# Patient Record
Sex: Male | Born: 1990 | Race: White | Hispanic: No | Marital: Single | Smoking: Current every day smoker
Health system: Southern US, Community
[De-identification: ages and names within clinical notes are randomized; demographics above are authoritative.]

## PROBLEM LIST (undated history)

## (undated) DIAGNOSIS — F319 Bipolar disorder, unspecified: Secondary | ICD-10-CM

## (undated) DIAGNOSIS — J45909 Unspecified asthma, uncomplicated: Secondary | ICD-10-CM

## (undated) DIAGNOSIS — F909 Attention-deficit hyperactivity disorder, unspecified type: Secondary | ICD-10-CM

## (undated) DIAGNOSIS — F1910 Other psychoactive substance abuse, uncomplicated: Secondary | ICD-10-CM

## (undated) DIAGNOSIS — G43909 Migraine, unspecified, not intractable, without status migrainosus: Secondary | ICD-10-CM

---
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
## 2009-09-11 ENCOUNTER — Emergency Department (HOSPITAL_COMMUNITY): Admission: EM | Admit: 2009-09-11 | Discharge: 2009-09-11 | Payer: Self-pay | Admitting: Emergency Medicine

## 2010-01-19 ENCOUNTER — Emergency Department (HOSPITAL_COMMUNITY)
Admission: EM | Admit: 2010-01-19 | Discharge: 2010-01-19 | Payer: Self-pay | Source: Home / Self Care | Admitting: Emergency Medicine

## 2010-05-20 ENCOUNTER — Emergency Department (HOSPITAL_COMMUNITY)
Admission: EM | Admit: 2010-05-20 | Discharge: 2010-05-20 | Disposition: A | Discharge status: Home / Self Care | Payer: Self-pay | Attending: Emergency Medicine | Admitting: Emergency Medicine

## 2010-05-20 DIAGNOSIS — R42 Dizziness and giddiness: Secondary | ICD-10-CM | POA: Insufficient documentation

## 2010-05-20 DIAGNOSIS — R209 Unspecified disturbances of skin sensation: Secondary | ICD-10-CM | POA: Insufficient documentation

## 2010-05-20 DIAGNOSIS — E860 Dehydration: Secondary | ICD-10-CM | POA: Insufficient documentation

## 2010-05-20 LAB — BASIC METABOLIC PANEL
BUN: 13 mg/dL (ref 6–23)
CO2: 20 mEq/L (ref 19–32)
Chloride: 103 mEq/L (ref 96–112)
Creatinine, Ser: 0.93 mg/dL (ref 0.4–1.5)
GFR calc Af Amer: >60 mL/min (ref >60)
GFR calc non Af Amer: >60 mL/min (ref >60)
Potassium: 3.2 mEq/L — ABNORMAL LOW (ref 3.5–5.1)

## 2010-05-20 LAB — CBC
HCT: 40.8 % (ref 39.0–52.0)
Hemoglobin: 14.5 g/dL (ref 13.0–17.0)
MCHC: 35.5 g/dL (ref 30.0–36.0)
MCV: 86.3 fL (ref 78.0–100.0)
Platelets: 262 10*3/uL (ref 150–400)
WBC: 8.2 10*3/uL (ref 4.0–10.5)

## 2010-05-20 LAB — DIFFERENTIAL
Eosinophils Absolute: 0.2 10*3/uL (ref 0.0–0.7)
Lymphocytes Relative: 24 % (ref 12–46)
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
## 2010-08-23 ENCOUNTER — Encounter: Payer: Self-pay | Admitting: Emergency Medicine

## 2010-08-23 ENCOUNTER — Emergency Department (INDEPENDENT_AMBULATORY_CARE_PROVIDER_SITE_OTHER): Payer: Self-pay

## 2010-08-23 ENCOUNTER — Emergency Department (HOSPITAL_BASED_OUTPATIENT_CLINIC_OR_DEPARTMENT_OTHER)
Admission: EM | Admit: 2010-08-23 | Discharge: 2010-08-24 | Disposition: A | Discharge status: Home / Self Care | Payer: Self-pay | Attending: Emergency Medicine | Admitting: Emergency Medicine

## 2010-08-23 DIAGNOSIS — S02610A Fracture of condylar process of mandible, unspecified side, initial encounter for closed fracture: Secondary | ICD-10-CM

## 2010-08-23 DIAGNOSIS — IMO0002 Reserved for concepts with insufficient information to code with codable children: Secondary | ICD-10-CM

## 2010-08-23 DIAGNOSIS — T148XXA Other injury of unspecified body region, initial encounter: Secondary | ICD-10-CM

## 2010-08-23 DIAGNOSIS — S0181XA Laceration without foreign body of other part of head, initial encounter: Secondary | ICD-10-CM

## 2010-08-23 DIAGNOSIS — F10929 Alcohol use, unspecified with intoxication, unspecified: Secondary | ICD-10-CM

## 2010-08-23 DIAGNOSIS — S0180XA Unspecified open wound of other part of head, initial encounter: Secondary | ICD-10-CM | POA: Insufficient documentation

## 2010-08-23 DIAGNOSIS — S02609A Fracture of mandible, unspecified, initial encounter for closed fracture: Secondary | ICD-10-CM | POA: Insufficient documentation

## 2010-08-23 DIAGNOSIS — F1010 Alcohol abuse, uncomplicated: Secondary | ICD-10-CM | POA: Insufficient documentation

## 2010-08-23 NOTE — ED Notes (Signed)
Pt was able to cooperate with CT scanning process with continued verbal and physical stimulation.  Upon returning to room pt states that he is "tired of dealing with this crap, he wants to go home" and then returned to sleep again.
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
## 2010-08-23 NOTE — ED Notes (Signed)
[facility] would not participate with the CT scanning process. Provider informed.

## 2010-08-24 ENCOUNTER — Encounter (HOSPITAL_BASED_OUTPATIENT_CLINIC_OR_DEPARTMENT_OTHER): Payer: Self-pay

## 2010-08-24 ENCOUNTER — Emergency Department (INDEPENDENT_AMBULATORY_CARE_PROVIDER_SITE_OTHER): Payer: Self-pay

## 2010-08-24 DIAGNOSIS — M25579 Pain in unspecified ankle and joints of unspecified foot: Secondary | ICD-10-CM

## 2010-08-24 DIAGNOSIS — M25569 Pain in unspecified knee: Secondary | ICD-10-CM

## 2010-08-24 MED ORDER — OXYCODONE-ACETAMINOPHEN 5-325 MG PO TABS: 2.0000 | ORAL_TABLET | ORAL | Status: AC | PRN

## 2010-08-24 MED ORDER — OXYCODONE-ACETAMINOPHEN 5-325 MG PO TABS
1.0000 | ORAL_TABLET | Freq: Once | ORAL | Status: AC | PRN
  Administered 2010-08-24: 1 via ORAL
  Filled 2010-08-24: qty 1

## 2010-08-24 MED ORDER — SODIUM CHLORIDE 0.9 % IV BOLUS (SEPSIS)
1000.0000 mL | Freq: Once | INTRAVENOUS | Status: AC
  Administered 2010-08-24: 1000 mL via INTRAVENOUS

## 2010-08-24 NOTE — ED Notes (Signed)
At this time patient remains difficult to arouse.  With continued stimulation pt did wake up and stated that he wished to have something for pain.  Pt was informed that he would have to remain awake for a period of time before he would be allowed to have something for pain.  Pt promptly returned to sleep.
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
## 2010-08-24 NOTE — ED Notes (Signed)
Pt has remained alert, oriented since going to radiology for x-rays of ankle and knee.  Pt states that he is in severe pain.  Pt was given PRN dose of percocet and was discharged to home via taxi.

## 2010-08-27 NOTE — ED Provider Notes (Signed)
Medical screening examination/treatment/procedure(s) were performed by non-physician practitioner and as supervising physician I was immediately available for consultation/collaboration.  Juliet Rude. Rubin Payor, MD 08/27/10 [phone]

## 2010-10-27 LAB — CULTURE, ROUTINE-ABSCESS

## 2011-08-02 ENCOUNTER — Emergency Department (HOSPITAL_COMMUNITY)
Admission: EM | Admit: 2011-08-02 | Discharge: 2011-08-02 | Disposition: A | Discharge status: Home / Self Care | Payer: Self-pay | Attending: Emergency Medicine | Admitting: Emergency Medicine

## 2011-08-02 ENCOUNTER — Encounter (HOSPITAL_COMMUNITY): Payer: Self-pay | Admitting: *Deleted

## 2011-08-02 DIAGNOSIS — F17200 Nicotine dependence, unspecified, uncomplicated: Secondary | ICD-10-CM | POA: Insufficient documentation

## 2011-08-02 DIAGNOSIS — F1910 Other psychoactive substance abuse, uncomplicated: Secondary | ICD-10-CM | POA: Insufficient documentation

## 2011-08-02 HISTORY — DX: Other psychoactive substance abuse, uncomplicated: F19.10
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
## 2011-08-02 NOTE — ED Provider Notes (Signed)
History     CSN: [id number]  Arrival date & time 08/02/11  1242   First MD Initiated Contact with Patient 08/02/11 1306      Chief Complaint  Patient presents with  . V70.1    pt sent from [facility] for medical clearance    (Consider location/radiation/quality/duration/timing/severity/associated sxs/prior treatment) The history is provided by the patient.   the patient reports she's been without alcohol or drugs for the past 30 days he was staying at the [facility] until 3 days ago.  He went out and started drinking and using drugs and has continued to the weekend.  He now requests assistance with his substance abuse problems.  He denies homicidal or suicidal thoughts.  He has no other complaints.  He reports he went to [facility] who referred him to the emergency department for medical clearance.  The patient is without medical complaints.  Past Medical History  Diagnosis Date  . Drug abuse     History reviewed. No pertinent past surgical history.  No family history on file.  History  Substance Use Topics  . Smoking status: Current Everyday Smoker -- 0.5 packs/day  . Smokeless tobacco: Not on file  . Alcohol Use: 9.0 oz/week    15 Cans of beer per week      Review of Systems  All other systems reviewed and are negative.    Allergies  Review of patient's allergies indicates no known allergies.  Home Medications   Current Outpatient Rx  Name Route Sig Dispense Refill  . ONE-DAILY MULTI VITAMINS PO TABS Oral Take 1 tablet by mouth daily.        BP 128/62  Pulse 79  Temp 98.1 F (36.7 C) (Oral)  Resp 18  Wt 165 lb (74.844 kg)  SpO2 99%  Physical Exam  Nursing note and vitals reviewed. Constitutional: He is oriented to person, place, and time. He appears well-developed and well-nourished.  HENT:  Head: Normocephalic and atraumatic.  Eyes: EOM are normal.  Neck: Normal range of motion.  Cardiovascular: Normal rate, regular rhythm, normal heart sounds and  intact distal pulses.   Pulmonary/Chest: Effort normal and breath sounds normal. No respiratory distress.  Abdominal: Soft. He exhibits no distension. There is no tenderness.  Musculoskeletal: Normal range of motion.  Neurological: He is alert and oriented to person, place, and time.  Skin: Skin is warm and dry.  Psychiatric: He has a normal mood and affect. Judgment normal.    ED Course  Procedures (including critical care time)  Labs Reviewed - No data to display No results found.   1. Substance abuse       MDM  This appears to be isolated substance abuse.  The patient has been clean for 30 days and is only been on drug and alcohol binge for the last several days.  He does not need inpatient detox.  This is not the appropriate avenue for long-term treatment programs.  The patient be discharged home with outpatient resources to be contacted        Lyanne Co, MD 08/02/11 1312

## 2012-10-21 ENCOUNTER — Encounter (HOSPITAL_COMMUNITY): Payer: Self-pay | Admitting: Physical Medicine and Rehabilitation

## 2012-10-21 ENCOUNTER — Emergency Department (HOSPITAL_COMMUNITY)
Admission: EM | Admit: 2012-10-21 | Discharge: 2012-10-21 | Disposition: A | Discharge status: Home / Self Care | Payer: Self-pay | Attending: Emergency Medicine | Admitting: Emergency Medicine

## 2012-10-21 DIAGNOSIS — R509 Fever, unspecified: Secondary | ICD-10-CM | POA: Insufficient documentation

## 2012-10-21 DIAGNOSIS — R110 Nausea: Secondary | ICD-10-CM | POA: Insufficient documentation

## 2012-10-21 DIAGNOSIS — R51 Headache: Secondary | ICD-10-CM | POA: Insufficient documentation

## 2012-10-21 DIAGNOSIS — F17200 Nicotine dependence, unspecified, uncomplicated: Secondary | ICD-10-CM | POA: Insufficient documentation

## 2012-10-21 DIAGNOSIS — J029 Acute pharyngitis, unspecified: Secondary | ICD-10-CM | POA: Insufficient documentation

## 2012-10-21 DIAGNOSIS — Z79899 Other long term (current) drug therapy: Secondary | ICD-10-CM | POA: Insufficient documentation

## 2012-10-21 LAB — RAPID STREP SCREEN (MED CTR MEBANE ONLY): Streptococcus, Group A Screen (Direct): NEGATIVE

## 2012-10-21 MED ORDER — HYDROCODONE-ACETAMINOPHEN 7.5-325 MG/15ML PO SOLN
10.0000 mL | ORAL | Status: AC
  Administered 2012-10-21: 10 mL via ORAL
  Filled 2012-10-21: qty 15

## 2012-10-21 MED ORDER — HYDROCODONE-ACETAMINOPHEN 7.5-325 MG/15ML PO SOLN: 10.0000 mL | ORAL | Status: DC | PRN

## 2012-10-21 MED ORDER — IBUPROFEN 800 MG PO TABS: 800.0000 mg | ORAL_TABLET | Freq: Three times a day (TID) | ORAL | Status: DC

## 2012-10-21 NOTE — ED Notes (Signed)
Pt c/o fever, sweating, body aches, headache, and sore throat. Pt states his boss was sick and he may have the flu. Pt rates pain 10/10 in throat. Pt states he is having difficulty swallowing. Pt smokes. Redness noted to back of throat.

## 2012-10-21 NOTE — ED Provider Notes (Signed)
CSN: [id number]     Arrival date & time 10/21/12  1301 History  This chart was scribed for non-physician practitioner, Trixie Dredge, PA-C working with Shanna Cisco, MD by Greggory Stallion, ED scribe. This patient was seen in room TR11C/TR11C and the patient's care was started at 1:29 PM.   Chief Complaint  Patient presents with  . Sore Throat   The history is provided by the patient. No language interpreter was used.    HPI Comments: Jeffery Hudson is a 22 y.o. male who presents to the Emergency Department complaining of gradual onset, constant sore throat with associated headache that started 3 days ago. He rates the pain 8/10. Pt states he has had nausea and subjective fever. He has not taken any medication for the pain. Pt denies abdominal pain.  Past Medical History  Diagnosis Date  . Drug abuse    No past surgical history on file. History reviewed. No pertinent family history. History  Substance Use Topics  . Smoking status: Current Every Day Smoker -- 0.50 packs/day    Types: Cigarettes  . Smokeless tobacco: Not on file  . Alcohol Use: No    Review of Systems  Constitutional: Positive for fever.  HENT: Positive for sore throat.   Gastrointestinal: Positive for nausea. Negative for abdominal pain.  Neurological: Positive for headaches.  All other systems reviewed and are negative.    Allergies  Review of patient's allergies indicates no known allergies.  Home Medications   Current Outpatient Rx  Name  Route  Sig  Dispense  Refill  . clonazePAM (KLONOPIN) 1 MG tablet   Oral   Take 1 mg by mouth 2 (two) times daily as needed. Anxiety         . Multiple Vitamin (MULTIVITAMIN) tablet   Oral   Take 1 tablet by mouth daily.            BP 149/93  Pulse 112  Temp(Src) 99.2 F (37.3 C) (Oral)  Resp 18  SpO2 96%  Physical Exam  Nursing note and vitals reviewed. Constitutional: He appears well-developed and well-nourished. No distress.  HENT:  Head:  Normocephalic and atraumatic.  Mouth/Throat: Uvula is midline.  Erythema, edema and tonsillar exudate.  Neck: Neck supple.  Anterior cervical lymphadenopathy.   Cardiovascular: Normal rate and regular rhythm.   Pulmonary/Chest: Effort normal and breath sounds normal. No respiratory distress. He has no wheezes. He has no rales. He exhibits no tenderness.  Neurological: He is alert.  Skin: He is not diaphoretic.    ED Course  Procedures (including critical care time)  DIAGNOSTIC STUDIES: Oxygen Saturation is 96% on RA, normal by my interpretation.    COORDINATION OF CARE: 1:33 PM-Discussed treatment plan which includes treating for strep with pt at bedside and pt agreed to plan.   Labs Review Labs Reviewed  RAPID STREP SCREEN  CULTURE, GROUP A STREP   Imaging Review No results found.  MDM   1. Pharyngitis    Pt with several days of sore throat, subjective fever, cough.  Lungs CTAB.  Oropharynx erythematous with exudate, +cervical lymphadenopathy.  Subjective fever.  No fever here.  Per [facility] Criteria, pt tested and sent for culture. No airway concerns. D/C home with lortab elixir, ibuprofen. Discussed result, findings, treatment, and follow up  with patient.  Pt given return precautions.  Pt verbalizes understanding and agrees with plan.        I personally performed the services described in this documentation, which was scribed  in my presence. The recorded information has been reviewed and is accurate.   Trixie Dredge, PA-C 10/21/12 1545

## 2012-10-21 NOTE — ED Notes (Signed)
Discharge instructions reviewed with pt. Pt verbalized understanding.   

## 2012-10-21 NOTE — ED Provider Notes (Signed)
Medical screening examination/treatment/procedure(s) were performed by non-physician practitioner and as supervising physician I was immediately available for consultation/collaboration.  Megan E Docherty, MD 10/21/12 1729 

## 2012-10-21 NOTE — ED Notes (Signed)
Pt presents to department for evaluation of sore throat and fever. Ongoing x1 week. 8/10 pain upon arrival. States "I think I am having problems with my tonsils." pt is alert and oriented x4.

## 2012-10-23 LAB — CULTURE, GROUP A STREP

## 2020-09-17 ENCOUNTER — Other Ambulatory Visit: Payer: Self-pay

## 2020-09-17 ENCOUNTER — Encounter (HOSPITAL_COMMUNITY): Payer: Self-pay | Admitting: Emergency Medicine

## 2020-09-17 ENCOUNTER — Emergency Department (HOSPITAL_COMMUNITY)
Admission: EM | Admit: 2020-09-17 | Discharge: 2020-09-18 | Disposition: A | Discharge status: Home / Self Care | Payer: Self-pay | Attending: Emergency Medicine | Admitting: Emergency Medicine

## 2020-09-17 DIAGNOSIS — R062 Wheezing: Secondary | ICD-10-CM | POA: Insufficient documentation

## 2020-09-17 DIAGNOSIS — Z79899 Other long term (current) drug therapy: Secondary | ICD-10-CM | POA: Insufficient documentation

## 2020-09-17 DIAGNOSIS — Z20822 Contact with and (suspected) exposure to covid-19: Secondary | ICD-10-CM | POA: Insufficient documentation

## 2020-09-17 DIAGNOSIS — R110 Nausea: Secondary | ICD-10-CM | POA: Insufficient documentation

## 2020-09-17 DIAGNOSIS — F1910 Other psychoactive substance abuse, uncomplicated: Secondary | ICD-10-CM | POA: Insufficient documentation

## 2020-09-17 DIAGNOSIS — M7918 Myalgia, other site: Secondary | ICD-10-CM | POA: Insufficient documentation

## 2020-09-17 DIAGNOSIS — Y900 Blood alcohol level of less than 20 mg/100 ml: Secondary | ICD-10-CM | POA: Insufficient documentation

## 2020-09-17 DIAGNOSIS — F17210 Nicotine dependence, cigarettes, uncomplicated: Secondary | ICD-10-CM | POA: Insufficient documentation

## 2020-09-17 LAB — BASIC METABOLIC PANEL
Anion gap: 10 (ref 5–15)
BUN: 16 mg/dL (ref 6–20)
CO2: 25 mmol/L (ref 22–32)
Calcium: 9.6 mg/dL (ref 8.9–10.3)
Chloride: 104 mmol/L (ref 98–111)
Creatinine, Ser: 0.91 mg/dL (ref 0.61–1.24)
GFR, Estimated: >60 mL/min (ref >60)
Glucose, Bld: 91 mg/dL (ref 70–99)
Potassium: 3.5 mmol/L (ref 3.5–5.1)
Sodium: 139 mmol/L (ref 135–145)

## 2020-09-17 LAB — RAPID URINE DRUG SCREEN, HOSP PERFORMED
Amphetamines: NOT DETECTED
Barbiturates: NOT DETECTED
Benzodiazepines: NOT DETECTED
Cocaine: NOT DETECTED
Opiates: NOT DETECTED
Tetrahydrocannabinol: POSITIVE — AB

## 2020-09-17 LAB — CBC WITH DIFFERENTIAL/PLATELET
Abs Immature Granulocytes: 0.02 10*3/uL (ref 0.00–0.07)
Basophils Absolute: 0.1 10*3/uL (ref 0.0–0.1)
Basophils Relative: 1 %
Eosinophils Absolute: 0.2 10*3/uL (ref 0.0–0.5)
Eosinophils Relative: 2 %
HCT: 48 % (ref 39.0–52.0)
Hemoglobin: 16.7 g/dL (ref 13.0–17.0)
Immature Granulocytes: 0 %
Lymphocytes Relative: 29 %
Lymphs Abs: 2.5 10*3/uL (ref 0.7–4.0)
MCH: 31.4 pg (ref 26.0–34.0)
MCHC: 34.8 g/dL (ref 30.0–36.0)
MCV: 90.2 fL (ref 80.0–100.0)
Monocytes Absolute: 1.4 10*3/uL — ABNORMAL HIGH (ref 0.1–1.0)
Monocytes Relative: 17 %
Neutro Abs: 4.4 10*3/uL (ref 1.7–7.7)
Neutrophils Relative %: 51 %
Platelets: 339 10*3/uL (ref 150–400)
RBC: 5.32 MIL/uL (ref 4.22–5.81)
RDW: 13 % (ref 11.5–15.5)
WBC: 8.5 10*3/uL (ref 4.0–10.5)
nRBC: 0 % (ref 0.0–0.2)

## 2020-09-17 LAB — ETHANOL: Alcohol, Ethyl (B): <10 mg/dL (ref <10)

## 2020-09-17 LAB — SALICYLATE LEVEL: Salicylate Lvl: <7 mg/dL — ABNORMAL LOW (ref 7.0–30.0)

## 2020-09-17 LAB — ACETAMINOPHEN LEVEL: Acetaminophen (Tylenol), Serum: <10 ug/mL — ABNORMAL LOW (ref 10–30)

## 2020-09-17 MED ORDER — ALBUTEROL SULFATE HFA 108 (90 BASE) MCG/ACT IN AERS
2.0000 | INHALATION_SPRAY | RESPIRATORY_TRACT | Status: DC | PRN
  Administered 2020-09-17: 2 via RESPIRATORY_TRACT
  Filled 2020-09-17: qty 6.7

## 2020-09-17 MED ORDER — ONDANSETRON HCL 4 MG/2ML IJ SOLN: 4.0000 mg | Freq: Once | INTRAMUSCULAR | Status: DC

## 2020-09-17 MED ORDER — ONDANSETRON 4 MG PO TBDP
4.0000 mg | ORAL_TABLET | Freq: Once | ORAL | Status: AC
  Administered 2020-09-17: 4 mg via ORAL
  Filled 2020-09-17: qty 1

## 2020-09-17 NOTE — ED Triage Notes (Signed)
Pt is here for detox off heroin, states he last used Monday night, and is having withdrawal symptoms. Also states he drinks 3-4 beers/night, has not drank today.

## 2020-09-17 NOTE — ED Provider Notes (Signed)
[facility] EMERGENCY DEPARTMENT Provider Note   CSN: [id number] Arrival date & time: 09/17/20  2103     History Chief Complaint  Patient presents with   Withdrawal    Jeffery Hudson is a 30 y.o. male.  Patient presents to the emergency department with a chief complaint of withdrawal.  He reports withdrawing from heroin.  States that he occasionally uses alcohol, but has not had any today.  He reports some generalized body aches and nausea.  He denies any suicidal or homicidal thoughts currently, but states that he felt somewhat suicidal earlier today.  Denies any recent illnesses.       Past Medical History:  Diagnosis Date   Drug abuse (HCC)     There are no problems to display for this patient.   History reviewed. No pertinent surgical history.     History reviewed. No pertinent family history.  Social History   Tobacco Use   Smoking status: Every Day    Packs/day: 1.00    Years: 10.00    Pack years: 10.00    Types: Cigarettes   Smokeless tobacco: Never  Substance Use Topics   Alcohol use: Yes    Alcohol/week: 28.0 standard drinks    Types: 28 Cans of beer per week    Comment: 3-4 beers per night   Drug use: Yes    Types: Cocaine    Comment: benzos, heroin    Home Medications Prior to Admission medications   Medication Sig Start Date End Date Taking? Authorizing Provider  HYDROcodone-acetaminophen (HYCET) 7.5-325 mg/15 ml solution Take 10 mLs by mouth every 4 (four) hours as needed for pain. 10/21/12   Trixie Dredge, PA-C  ibuprofen (ADVIL,MOTRIN) 800 MG tablet Take 1 tablet (800 mg total) by mouth 3 (three) times daily. 10/21/12   Trixie Dredge, PA-C    Allergies    Patient has no known allergies.  Review of Systems   Review of Systems  All other systems reviewed and are negative.  Physical Exam Updated Vital Signs BP (!) 150/107 (BP Location: Left Arm)   Pulse 78   Temp (!) 97.5 F (36.4 C) (Oral)   Resp 16   Ht 5\' 8"  (1.727  m)   Wt 77.1 kg   SpO2 100%   BMI 25.85 kg/m   Physical Exam Vitals and nursing note reviewed.  Constitutional:      Appearance: He is well-developed.  HENT:     Head: Normocephalic and atraumatic.  Eyes:     Conjunctiva/sclera: Conjunctivae normal.  Cardiovascular:     Rate and Rhythm: Normal rate and regular rhythm.     Heart sounds: No murmur heard. Pulmonary:     Effort: Pulmonary effort is normal. No respiratory distress.     Breath sounds: Wheezing present.  Abdominal:     Palpations: Abdomen is soft.     Tenderness: There is no abdominal tenderness.  Musculoskeletal:     Cervical back: Neck supple.  Skin:    General: Skin is warm and dry.  Neurological:     Mental Status: He is alert and oriented to person, place, and time.  Psychiatric:        Mood and Affect: Mood normal.        Behavior: Behavior normal.    ED Results / Procedures / Treatments   Labs (all labs ordered are listed, but only abnormal results are displayed) Labs Reviewed  CBC WITH DIFFERENTIAL/PLATELET - Abnormal; Notable for the following components:  Result Value   Monocytes Absolute 1.4 (*)    All other components within normal limits  BASIC METABOLIC PANEL  RAPID URINE DRUG SCREEN, HOSP PERFORMED  ETHANOL  ACETAMINOPHEN LEVEL  SALICYLATE LEVEL    EKG None  Radiology No results found.  Procedures Procedures   Medications Ordered in ED Medications  albuterol (VENTOLIN HFA) 108 (90 Base) MCG/ACT inhaler 2 puff (has no administration in time range)  ondansetron (ZOFRAN-ODT) disintegrating tablet 4 mg (4 mg Oral Given 09/17/20 2200)    ED Course  I have reviewed the triage vital signs and the nursing notes.  Pertinent labs & imaging results that were available during my care of the patient were reviewed by me and considered in my medical decision making (see chart for details).    MDM Rules/Calculators/A&P                           Patient here for heroin withdrawal.   He appears medically clear pending normal labs.  Social work informs me that he is able to get into [facility] tonight if medically cleared.  11:09 PM Blood work is reassuring.  Appears stable for discharge to [facility]. Final Clinical Impression(s) / ED Diagnoses Final diagnoses:  Polysubstance abuse [facility])    Rx / DC Orders ED Discharge Orders     None        Roxy Horseman, PA-C 09/17/20 2310    Jacalyn Lefevre, MD 09/18/20 1656

## 2020-09-17 NOTE — Discharge Instructions (Addendum)
Go to [facility] now for recovery services.

## 2020-09-17 NOTE — ED Notes (Signed)
RN reviewed discharge instructions w/ pt. Follow up and outpatient resources reviewed, pt had no further questions. 

## 2020-09-17 NOTE — Social Work (Signed)
CSW met with Pt in triage room. Pt endorses history of polysubstance use. Pt reports smoking heroin for the past year. Pt wishing to go to detox. CSW coordinated sending Pt to [facility] for further evaluation and inpatient detox. Pt's Aunt will provide transportation.

## 2020-09-17 NOTE — ED Provider Notes (Signed)
Emergency Medicine Provider Triage Evaluation Note  Jeffery Hudson , a 30 y.o. male  was evaluated in triage.  Pt complains of withdrawal symptoms from meth. He says he smokes meth, does not inject. The last time he used was 1.5 days ago. He is otherwise completely sober. He denies SI/HI/AVH. He is worried about how he's going to sleep at night. He feels nauseous, some shortness of breath diaphoretic. He has no chest pain.  Review of Systems  Positive: as above Negative: as above   Physical Exam  BP (!) 150/107 (BP Location: Left Arm)   Pulse 78   Temp (!) 97.5 F (36.4 C) (Oral)   Resp 16   Ht 5\' 8"  (1.727 m)   Wt 77.1 kg   SpO2 100%   BMI 25.85 kg/m  Gen:   Awake, no distress, clinically sober Resp:  Normal effort  MSK:   Moves extremities without difficulty  Other:    Medical Decision Making  Medically screening exam initiated at 9:41 PM.  Appropriate orders placed.  Jeffery Hudson was informed that the remainder of the evaluation will be completed by another provider, this initial triage assessment does not replace that evaluation, and the importance of remaining in the ED until their evaluation is complete.  withdrawal   Jeffery Hudson 09/17/20 2143    2144, MD 09/18/20 [phone]

## 2020-09-18 LAB — RESP PANEL BY RT-PCR (FLU A&B, COVID) ARPGX2
Influenza A by PCR: NEGATIVE
Influenza B by PCR: NEGATIVE
SARS Coronavirus 2 by RT PCR: NEGATIVE

## 2020-12-04 ENCOUNTER — Emergency Department (HOSPITAL_COMMUNITY): Payer: Self-pay

## 2020-12-04 ENCOUNTER — Observation Stay (HOSPITAL_COMMUNITY): Payer: Self-pay

## 2020-12-04 ENCOUNTER — Inpatient Hospital Stay (HOSPITAL_COMMUNITY)
Admission: EM | Admit: 2020-12-04 | Discharge: 2020-12-07 | DRG: 917 | Disposition: A | Discharge status: Home / Self Care | Payer: Self-pay | Attending: Internal Medicine | Admitting: Internal Medicine

## 2020-12-04 ENCOUNTER — Encounter (HOSPITAL_COMMUNITY): Payer: Self-pay

## 2020-12-04 DIAGNOSIS — T402X1A Poisoning by other opioids, accidental (unintentional), initial encounter: Secondary | ICD-10-CM | POA: Diagnosis present

## 2020-12-04 DIAGNOSIS — Z9889 Other specified postprocedural states: Secondary | ICD-10-CM

## 2020-12-04 DIAGNOSIS — M25512 Pain in left shoulder: Secondary | ICD-10-CM

## 2020-12-04 DIAGNOSIS — G8191 Hemiplegia, unspecified affecting right dominant side: Secondary | ICD-10-CM | POA: Diagnosis present

## 2020-12-04 DIAGNOSIS — T40411A Poisoning by fentanyl or fentanyl analogs, accidental (unintentional), initial encounter: Principal | ICD-10-CM | POA: Diagnosis present

## 2020-12-04 DIAGNOSIS — R7989 Other specified abnormal findings of blood chemistry: Secondary | ICD-10-CM | POA: Diagnosis present

## 2020-12-04 DIAGNOSIS — D72829 Elevated white blood cell count, unspecified: Secondary | ICD-10-CM | POA: Diagnosis present

## 2020-12-04 DIAGNOSIS — N179 Acute kidney failure, unspecified: Secondary | ICD-10-CM | POA: Diagnosis present

## 2020-12-04 DIAGNOSIS — R778 Other specified abnormalities of plasma proteins: Secondary | ICD-10-CM | POA: Diagnosis present

## 2020-12-04 DIAGNOSIS — Z8041 Family history of malignant neoplasm of ovary: Secondary | ICD-10-CM

## 2020-12-04 DIAGNOSIS — M25511 Pain in right shoulder: Secondary | ICD-10-CM | POA: Diagnosis not present

## 2020-12-04 DIAGNOSIS — G936 Cerebral edema: Principal | ICD-10-CM | POA: Diagnosis present

## 2020-12-04 DIAGNOSIS — M6282 Rhabdomyolysis: Secondary | ICD-10-CM | POA: Diagnosis present

## 2020-12-04 DIAGNOSIS — F1010 Alcohol abuse, uncomplicated: Secondary | ICD-10-CM | POA: Diagnosis present

## 2020-12-04 DIAGNOSIS — Z20822 Contact with and (suspected) exposure to covid-19: Secondary | ICD-10-CM | POA: Diagnosis present

## 2020-12-04 DIAGNOSIS — F17210 Nicotine dependence, cigarettes, uncomplicated: Secondary | ICD-10-CM | POA: Diagnosis present

## 2020-12-04 DIAGNOSIS — G8911 Acute pain due to trauma: Secondary | ICD-10-CM

## 2020-12-04 DIAGNOSIS — T17908A Unspecified foreign body in respiratory tract, part unspecified causing other injury, initial encounter: Secondary | ICD-10-CM

## 2020-12-04 DIAGNOSIS — Z818 Family history of other mental and behavioral disorders: Secondary | ICD-10-CM

## 2020-12-04 DIAGNOSIS — R531 Weakness: Secondary | ICD-10-CM

## 2020-12-04 DIAGNOSIS — F1910 Other psychoactive substance abuse, uncomplicated: Secondary | ICD-10-CM | POA: Diagnosis present

## 2020-12-04 DIAGNOSIS — Z8249 Family history of ischemic heart disease and other diseases of the circulatory system: Secondary | ICD-10-CM

## 2020-12-04 HISTORY — DX: Unspecified asthma, uncomplicated: J45.909

## 2020-12-04 HISTORY — DX: Attention-deficit hyperactivity disorder, unspecified type: F90.9

## 2020-12-04 HISTORY — DX: Migraine, unspecified, not intractable, without status migrainosus: G43.909

## 2020-12-04 HISTORY — DX: Bipolar disorder, unspecified: F31.9

## 2020-12-04 LAB — I-STAT CHEM 8, ED
BUN: 30 mg/dL — ABNORMAL HIGH (ref 6–20)
Calcium, Ion: 1.15 mmol/L (ref 1.15–1.40)
Chloride: 104 mmol/L (ref 98–111)
Creatinine, Ser: 1.5 mg/dL — ABNORMAL HIGH (ref 0.61–1.24)
Glucose, Bld: 128 mg/dL — ABNORMAL HIGH (ref 70–99)
HCT: 45 % (ref 39.0–52.0)
Hemoglobin: 15.3 g/dL (ref 13.0–17.0)
Potassium: 4.7 mmol/L (ref 3.5–5.1)
Sodium: 136 mmol/L (ref 135–145)
TCO2: 24 mmol/L (ref 22–32)

## 2020-12-04 LAB — DIFFERENTIAL
Abs Immature Granulocytes: 0.41 10*3/uL — ABNORMAL HIGH (ref 0.00–0.07)
Basophils Absolute: 0.1 10*3/uL (ref 0.0–0.1)
Basophils Relative: 0 %
Eosinophils Absolute: 0 10*3/uL (ref 0.0–0.5)
Eosinophils Relative: 0 %
Immature Granulocytes: 2 %
Lymphocytes Relative: 4 %
Lymphs Abs: 1.1 10*3/uL (ref 0.7–4.0)
Monocytes Absolute: 0.6 10*3/uL (ref 0.1–1.0)
Monocytes Relative: 2 %
Neutro Abs: 21.7 10*3/uL — ABNORMAL HIGH (ref 1.7–7.7)
Neutrophils Relative %: 92 %

## 2020-12-04 LAB — CBC
HCT: 42.7 % (ref 39.0–52.0)
Hemoglobin: 14.4 g/dL (ref 13.0–17.0)
MCH: 31.5 pg (ref 26.0–34.0)
MCHC: 33.7 g/dL (ref 30.0–36.0)
MCV: 93.4 fL (ref 80.0–100.0)
Platelets: 284 10*3/uL (ref 150–400)
RBC: 4.57 MIL/uL (ref 4.22–5.81)
RDW: 13.9 % (ref 11.5–15.5)
WBC: 23.8 10*3/uL — ABNORMAL HIGH (ref 4.0–10.5)
nRBC: 0 % (ref 0.0–0.2)

## 2020-12-04 LAB — COMPREHENSIVE METABOLIC PANEL
ALT: 87 U/L — ABNORMAL HIGH (ref 0–44)
AST: 301 U/L — ABNORMAL HIGH (ref 15–41)
Albumin: 4.5 g/dL (ref 3.5–5.0)
Alkaline Phosphatase: 42 U/L (ref 38–126)
Anion gap: 10 (ref 5–15)
BUN: 27 mg/dL — ABNORMAL HIGH (ref 6–20)
CO2: 21 mmol/L — ABNORMAL LOW (ref 22–32)
Calcium: 8.8 mg/dL — ABNORMAL LOW (ref 8.9–10.3)
Chloride: 103 mmol/L (ref 98–111)
Creatinine, Ser: 1.41 mg/dL — ABNORMAL HIGH (ref 0.61–1.24)
GFR, Estimated: >60 mL/min (ref >60)
Glucose, Bld: 139 mg/dL — ABNORMAL HIGH (ref 70–99)
Potassium: 4.5 mmol/L (ref 3.5–5.1)
Sodium: 134 mmol/L — ABNORMAL LOW (ref 135–145)
Total Bilirubin: 0.6 mg/dL (ref 0.3–1.2)
Total Protein: 7.1 g/dL (ref 6.5–8.1)

## 2020-12-04 LAB — APTT: aPTT: 27 seconds (ref 24–36)

## 2020-12-04 LAB — PROTIME-INR
INR: 1 (ref 0.8–1.2)
Prothrombin Time: 12.9 s (ref 11.4–15.2)

## 2020-12-04 LAB — RESP PANEL BY RT-PCR (FLU A&B, COVID) ARPGX2
Influenza A by PCR: NEGATIVE
Influenza B by PCR: NEGATIVE
SARS Coronavirus 2 by RT PCR: NEGATIVE

## 2020-12-04 LAB — URINALYSIS, ROUTINE W REFLEX MICROSCOPIC
Bilirubin Urine: NEGATIVE
Glucose, UA: NEGATIVE mg/dL
Hgb urine dipstick: NEGATIVE
Ketones, ur: NEGATIVE mg/dL
Leukocytes,Ua: NEGATIVE
Nitrite: NEGATIVE
Protein, ur: NEGATIVE mg/dL
Specific Gravity, Urine: 1.004 — ABNORMAL LOW (ref 1.005–1.030)
pH: 6 (ref 5.0–8.0)

## 2020-12-04 LAB — RAPID URINE DRUG SCREEN, HOSP PERFORMED
Amphetamines: NOT DETECTED
Barbiturates: NOT DETECTED
Benzodiazepines: NOT DETECTED
Cocaine: POSITIVE — AB
Opiates: NOT DETECTED
Tetrahydrocannabinol: POSITIVE — AB

## 2020-12-04 LAB — ETHANOL: Alcohol, Ethyl (B): <10 mg/dL (ref <10)

## 2020-12-04 LAB — TROPONIN I (HIGH SENSITIVITY): Troponin I (High Sensitivity): 2416 ng/L (ref <18)

## 2020-12-04 MED ORDER — ONDANSETRON HCL 4 MG/2ML IJ SOLN: 4.0000 mg | Freq: Four times a day (QID) | INTRAMUSCULAR | Status: DC | PRN

## 2020-12-04 MED ORDER — NICOTINE 21 MG/24HR TD PT24
21.0000 mg | MEDICATED_PATCH | Freq: Every day | TRANSDERMAL | Status: DC
  Administered 2020-12-05 – 2020-12-06 (×2): 21 mg via TRANSDERMAL
  Filled 2020-12-04 (×3): qty 1

## 2020-12-04 MED ORDER — AMOXICILLIN-POT CLAVULANATE 875-125 MG PO TABS
1.0000 | ORAL_TABLET | Freq: Two times a day (BID) | ORAL | Status: DC
  Administered 2020-12-04 – 2020-12-07 (×6): 1 via ORAL
  Filled 2020-12-04 (×6): qty 1

## 2020-12-04 MED ORDER — LACTATED RINGERS IV SOLN: INTRAVENOUS | Status: DC

## 2020-12-04 MED ORDER — IOHEXOL 350 MG/ML SOLN
100.0000 mL | Freq: Once | INTRAVENOUS | Status: AC | PRN
  Administered 2020-12-04: 09:00:00 100 mL via INTRAVENOUS

## 2020-12-04 MED ORDER — ACETAMINOPHEN 325 MG PO TABS: 650.0000 mg | ORAL_TABLET | Freq: Four times a day (QID) | ORAL | Status: DC | PRN

## 2020-12-04 MED ORDER — KETOROLAC TROMETHAMINE 15 MG/ML IJ SOLN
15.0000 mg | Freq: Once | INTRAMUSCULAR | Status: AC
  Administered 2020-12-04: 16:00:00 15 mg via INTRAVENOUS
  Filled 2020-12-04: qty 1

## 2020-12-04 MED ORDER — LORAZEPAM 2 MG/ML IJ SOLN
1.0000 mg | Freq: Once | INTRAMUSCULAR | Status: AC
Start: 2020-12-04 — End: 2020-12-04
  Administered 2020-12-04: 10:00:00 1 mg via INTRAVENOUS
  Filled 2020-12-04: qty 1

## 2020-12-04 MED ORDER — NICOTINE 21 MG/24HR TD PT24
21.0000 mg | MEDICATED_PATCH | Freq: Once | TRANSDERMAL | Status: AC
  Administered 2020-12-04: 13:00:00 21 mg via TRANSDERMAL
  Filled 2020-12-04: qty 1

## 2020-12-04 MED ORDER — ACETAMINOPHEN 650 MG RE SUPP: 650.0000 mg | Freq: Four times a day (QID) | RECTAL | Status: DC | PRN

## 2020-12-04 MED ORDER — LACTATED RINGERS IV BOLUS
2000.0000 mL | Freq: Once | INTRAVENOUS | Status: AC
  Administered 2020-12-04: 16:00:00 2000 mL via INTRAVENOUS

## 2020-12-04 MED ORDER — ONDANSETRON HCL 4 MG PO TABS: 4.0000 mg | ORAL_TABLET | Freq: Four times a day (QID) | ORAL | Status: DC | PRN

## 2020-12-04 MED ORDER — AMOXICILLIN-POT CLAVULANATE 875-125 MG PO TABS
1.0000 | ORAL_TABLET | Freq: Once | ORAL | Status: AC
  Administered 2020-12-04: 10:00:00 1 via ORAL
  Filled 2020-12-04: qty 1

## 2020-12-04 MED ORDER — SODIUM CHLORIDE (PF) 0.9 % IJ SOLN
INTRAMUSCULAR | Status: AC
  Filled 2020-12-04: qty 100

## 2020-12-04 MED ORDER — ENOXAPARIN SODIUM 40 MG/0.4ML IJ SOSY
40.0000 mg | PREFILLED_SYRINGE | INTRAMUSCULAR | Status: DC
  Administered 2020-12-05 – 2020-12-06 (×2): 40 mg via SUBCUTANEOUS
  Filled 2020-12-04 (×3): qty 0.4

## 2020-12-04 MED ORDER — STROKE: EARLY STAGES OF RECOVERY BOOK
Freq: Once | Status: DC
  Filled 2020-12-04 (×2): qty 1

## 2020-12-04 NOTE — ED Notes (Signed)
To CT

## 2020-12-04 NOTE — Consult Note (Signed)
Neurology Consultation  Reason for Consult: Right-sided weakness and sensory deficits Referring Physician: Dr. Gilford Raid  CC: Right upper extremity weakness  History is obtained from: Patient, Chart review  HPI: Jeffery Hudson is a 30 y.o. male with a medical history significant for substance abuse and tobacco dependence who presented to [facility] 11/17 via EMS for evaluation of respiratory distress and unresponsiveness at a hotel. Patient states that he remembers feeling well at 20:00 last night when he was with friends and feels that he overdosed on fentanyl but does not recall events leading to hospitalization. He was given a total of 2 mg of Narcan and has had improvement in his mental status since hospital arrival. While in the hospital, patient complains of right upper extremity weakness, decreased sensation, and tingling and has been witnessed dragging his right leg with unsteady ambulation and neurology was consulted for further evaluation.    ROS: A complete ROS was performed and is negative except as noted in the HPI.   Past Medical History:  Diagnosis Date   Drug abuse ([place])    No past surgical history on file.  No family history on file.  Social History:   reports that he has been smoking cigarettes. He has a 10.00 pack-year smoking history. He has never used smokeless tobacco. He reports current alcohol use of about 28.0 standard drinks per week. He reports current drug use. Drug: Cocaine.  Medications  Current Facility-Administered Medications:    nicotine (NICODERM CQ - dosed in mg/24 hours) patch 21 mg, 21 mg, Transdermal, Once, Harris, Abigail, PA-C   sodium chloride (PF) 0.9 % injection, , , ,   Current Outpatient Medications:    HYDROcodone-acetaminophen (HYCET) 7.5-325 mg/15 ml solution, Take 10 mLs by mouth every 4 (four) hours as needed for pain. (Patient not taking: Reported on 12/04/2020), Disp: 120 mL, Rfl: 0   ibuprofen (ADVIL,MOTRIN) 800 MG tablet, Take 1 tablet  (800 mg total) by mouth 3 (three) times daily. (Patient not taking: Reported on 12/04/2020), Disp: 21 tablet, Rfl: 0  Exam: Current vital signs: BP 131/85   Pulse 82   Temp 98 F (36.7 C) (Oral)   Resp 15   SpO2 100%  Vital signs in last 24 hours: Temp:  [98 F (36.7 C)] 98 F (36.7 C) (11/17 0239) Pulse Rate:  [82-114] 82 (11/17 1000) Resp:  [9-17] 15 (11/17 1000) BP: (118-133)/(79-93) 131/85 (11/17 1000) SpO2:  [87 %-100 %] 100 % (11/17 1000)  GENERAL: Awake, alert, in no acute distress Psych: Affect appropriate for situation, patient is calm and cooperative with examination Head: Normocephalic and atraumatic, without obvious abnormality EENT: Normal conjunctivae, dry mucous membranes, no OP obstruction LUNGS: Normal respiratory effort. Non-labored breathing on room air CV: Regular rate and rhythm on telemetry ABDOMEN: Soft, non-tender, non-distended Extremities: warm, well perfused, without obvious deformity  NEURO:  Mental Status: Awake, alert, and oriented to person, place, time, and situation. He is amnestic to events leading to hospitalization.  Speech/Language: speech is intact without dysarthria.   Naming, repetition, fluency, and comprehension intact without aphasia. No neglect is noted Cranial Nerves:  II: PERRL 3 mm/brisk. Visual fields full.  III, IV, VI: EOMI without ptosis or nystagmus V: Sensation is intact to light touch and symmetrical to face.  VII: Face is symmetric resting and smiling.  VIII: Hearing is intact to voice IX, X: Palate elevation is symmetric. Phonation normal.  XI: Normal sternocleidomastoid and trapezius muscle strength XII: Tongue protrudes midline without fasciculations.   Motor: 5/5  strength present in left upper and lower extremity. Right upper extremity does not have movement except for the fingers.  Right lower extremity is able to elevate antigravity without vertical drift with 5/5 ankle dorsiflexion and plantar flexion.  Tone  is normal. Bulk is normal.  Sensation: Patient reports numbness of the right upper extremity starting at his mid-deltoid distally including his 4th and 5th digits with normal sensation in the 1st-3rd digits. He reports a tingling and burning sensation to pinprick in the right upper extremity. Right lower extremity there is reported decreased sensation to light touch proximal to the knee and numbness of the right ankle and foot.  There is normal sensation reported to the left upper and lower extremities and face.  Coordination: FTN intact on the left, unable to assess on the right due to weakness. No dysmetria noted in bilateral lower extremities.  DTRs: 2+ and symmetric throughout.  Gait: Deferred  Labs I have reviewed labs in epic and the results pertinent to this consultation are: CBC    Component Value Date/Time   WBC 23.8 (H) 12/04/2020 0853   RBC 4.57 12/04/2020 0853   HGB 14.4 12/04/2020 0853   HCT 42.7 12/04/2020 0853   PLT 284 12/04/2020 0853   MCV 93.4 12/04/2020 0853   MCH 31.5 12/04/2020 0853   MCHC 33.7 12/04/2020 0853   RDW 13.9 12/04/2020 0853   LYMPHSABS 1.1 12/04/2020 0853   MONOABS 0.6 12/04/2020 0853   EOSABS 0.0 12/04/2020 0853   BASOSABS 0.1 12/04/2020 0853   CMP     Component Value Date/Time   NA 134 (L) 12/04/2020 0853   K 4.5 12/04/2020 0853   CL 103 12/04/2020 0853   CO2 21 (L) 12/04/2020 0853   GLUCOSE 139 (H) 12/04/2020 0853   BUN 27 (H) 12/04/2020 0853   CREATININE 1.41 (H) 12/04/2020 0853   CALCIUM 8.8 (L) 12/04/2020 0853   PROT 7.1 12/04/2020 0853   ALBUMIN 4.5 12/04/2020 0853   AST 301 (H) 12/04/2020 0853   ALT 87 (H) 12/04/2020 0853   ALKPHOS 42 12/04/2020 0853   BILITOT 0.6 12/04/2020 0853   GFRNONAA >60 12/04/2020 0853   GFRAA  05/20/2010 1335    >60        The eGFR has been calculated using the MDRD equation. This calculation has not been validated in all clinical situations. eGFR's persistently <60 mL/min signify possible Chronic  Kidney Disease.   Lipid Panel  No results found for: CHOL, TRIG, HDL, CHOLHDL, VLDL, LDLCALC, LDLDIRECT  Imaging I have reviewed the images obtained:  CT-scan of the brain 11/17: 1. Normal noncontrast CT appearance of the brain. 2. Trace left maxillary sinus fluid level.   CT angio head and neck with cerebral perfusion 11/17: - No large vessel occlusion, hemodynamically significant stenosis, or evidence of dissection. Perfusion imaging demonstrates no evidence of core infarction or penumbra. - Patchy ground-glass density in the included upper lobes suspicious for pneumonia. - Asymmetric nonenlarged left supraclavicular and cervical lymph nodes are probably reactive.   Assessment: 30 year old male who presented to the [facility] 11/17 for evaluation of respiratory distress s/p fentanyl overdose with right upper extremity weakness, paresthesias, and right lower extremity sensory deficits and dragging right foot with ambulation. - Examination reveals patient without moving the right upper extremity paresthesias from the deltoid distally with involvement of the fourth and fifth digits with pressure sensation and movement of the first through third digits.  Right lower extremity exam this is without obvious weakness the patient complains  of dragging with ambulation and right ankle and foot numbness. - CT imaging was complete without acute intracranial abnormality, large vessel occlusion, hemodynamically significant stenosis, or evidence of dissection. - Low suspicion for acute stroke versus anoxic brain injury however will obtain MRI to definitively rule out.  MRI cervical spine pending.  Recommendations: - MRI brain  - MRI cervical spine - Further recommendations pending imaging results  Pt seen by NP/Neuro and later by MD. Note/plan to be edited by MD as needed.  Anibal Henderson, AGAC-NP [facility] Pager: [phone]  I have seen the patient reviewed the above note.  At the time  of my finalizing this note, the above imaging has been obtained and does show some changes of the basal ganglia that are commonly associated with fentanyl overdose.  Location of these insults could cause unilateral weakness that he describes.  He is already starting to notice some improvement, I suspect that he may have some gradual improvement over time, but whether he improves to normal is unclear.  The distribution is not really consistent with a peripheral issue, but if he does have persistent problems, could consider an EMG as an outpatient.  He will need PT/OT evaluations, and would consider psychiatry referral for assistance with substance abuse cessation.  No further recommendations at this time, please call with further questions or concerns.  Roland Rack, MD [facility] [phone]  If 7pm- 7am, please page neurology on call as listed in [place].

## 2020-12-04 NOTE — ED Provider Notes (Signed)
[facility]-EMERGENCY DEPT Provider Note   CSN: [id number] Arrival date & time: 12/04/20  0216     History No chief complaint on file.   ERCIL CASSIS is a 30 y.o. male.  Patient to ED by EMS called to a hotel for respiratory distress. Per EMS, the patient was given Albuterol, Atrovent and Solumedrol. He was also given a total of 2 mg Narcan. The patient reports he overdosed on Fentanyl, both snorting and injecting. No vomiting. He denies pain or known injury. History of asthma.   The history is provided by the patient. No language interpreter was used.      Past Medical History:  Diagnosis Date   Drug abuse (HCC)     There are no problems to display for this patient.   No past surgical history on file.     No family history on file.  Social History   Tobacco Use   Smoking status: Every Day    Packs/day: 1.00    Years: 10.00    Pack years: 10.00    Types: Cigarettes   Smokeless tobacco: Never  Substance Use Topics   Alcohol use: Yes    Alcohol/week: 28.0 standard drinks    Types: 28 Cans of beer per week    Comment: 3-4 beers per night   Drug use: Yes    Types: Cocaine    Comment: benzos, heroin    Home Medications Prior to Admission medications   Medication Sig Start Date End Date Taking? Authorizing Provider  HYDROcodone-acetaminophen (HYCET) 7.5-325 mg/15 ml solution Take 10 mLs by mouth every 4 (four) hours as needed for pain. 10/21/12   Trixie Dredge, PA-C  ibuprofen (ADVIL,MOTRIN) 800 MG tablet Take 1 tablet (800 mg total) by mouth 3 (three) times daily. 10/21/12   Trixie Dredge, PA-C    Allergies    Patient has no known allergies.  Review of Systems   Review of Systems  Unable to perform ROS: Other (Overdose)   Physical Exam Updated Vital Signs BP 127/84 (BP Location: Right Arm)   Pulse (!) 107   Temp 98 F (36.7 C) (Oral)   Resp 14   SpO2 95%   Physical Exam Vitals and nursing note reviewed.  Constitutional:       General: He is not in acute distress.    Appearance: Normal appearance. He is normal weight.  HENT:     Head: Normocephalic and atraumatic.  Cardiovascular:     Rate and Rhythm: Tachycardia present.     Heart sounds: No murmur heard. Pulmonary:     Effort: Pulmonary effort is normal. No respiratory distress.     Breath sounds: No wheezing, rhonchi or rales.  Abdominal:     General: There is no distension.     Palpations: Abdomen is soft.  Musculoskeletal:     Cervical back: Normal range of motion.     Comments: No injection site redness or cellulitic changes  Skin:    General: Skin is warm and dry.  Neurological:     Comments: Wakes easily to calling his name, somnolent but responsive    ED Results / Procedures / Treatments   Labs (all labs ordered are listed, but only abnormal results are displayed) Labs Reviewed - No data to display  EKG None  Radiology No results found.  Procedures Procedures   Medications Ordered in ED Medications - No data to display  ED Course  I have reviewed the triage vital signs and the nursing notes.  Pertinent labs & imaging results that were available during my care of the patient were reviewed by me and considered in my medical decision making (see chart for details).    MDM Rules/Calculators/A&P                           Patient to ED via EMS after overdose with respiratory distress requiring oxygen support, multiple doses of Narcan and nebulizer treatment.   He has been observed over 3 hours and has improved over time. He is no longer needing oxygen and is maintaining a normal O2 saturation. He is drinking and eating. He is alert, oriented.   He is complaining of right arm numbness, "like it's asleep". He eventually describes similar symptoms in the right LE. Reflexes remain symmetric in UE and LE's. He is flexing both legs voluntarily, has equal plantar and dorsiflexion strength. He, however, refuses to bear weight on the right  leg when attempting to ambulate. Have low suspicion for acute neurologic condition but will obtain Head CT.   Patient care signed out to Huntley Dec, [facility], pending CT review and re-evaluation.  Final Clinical Impression(s) / ED Diagnoses Final diagnoses:  None   Overdose Paresthesias   Rx / DC Orders ED Discharge Orders     None        Elpidio Anis, PA-C 12/04/20 3846    Geoffery Lyons, MD 12/04/20 [phone]

## 2020-12-04 NOTE — ED Triage Notes (Signed)
EMS called for overdose. Patient was in respiratory distress and unresponsive with sternal rubs. Patient received 1.5mg  narcan IM, .5 narcan IV. Patient was lethargic after and remains lethargic upon arrival. EMA also gave 10mg  albuterol,  atrovent, 125mg  solumedrol.

## 2020-12-04 NOTE — H&P (Signed)
History and Physical    SEPEHR PLYLER QZE:[id number] DOB: December 10, 1990 DOA: 12/04/2020  PCP: Patient, No Pcp Per (Inactive)  Patient coming from: Home.  I have personally briefly reviewed patient's old medical records in [facility]  Chief Complaint: Overdose.  HPI: TERON KOSTOFF is a 30 y.o. male with medical history significant of opioid and alcohol abuse who was brought via EMS to the emergency department yesterday evening due to unresponsiveness in the setting of overdose.  The patient received 1.5 mg of Narcan IM and 0.5 mg of Narcan IV.  He also received at 10 mg albuterol plus ipratropium 0.5 mg neb and methylprednisolone 125 mg IVP.  When he woke up this morning, he noticed that he had right-sided weakness.  Weakness is more pronounced on the LUE.  No headache, nausea, vision changes, slurred speech, but his mother stated that he has been having some minor issues with language comprehension.  His symptoms have improved since he woke up this morning.  He denies sore throat, rhinorrhea, wheezing or hemoptysis.  No chest pain, palpitations, diaphoresis, PND, orthopnea or pitting edema of the lower extremities.  No abdominal pain, emesis, diarrhea, constipation, melena or hematochezia.  No flank pain, dysuria, frequency or hematuria.  No polyuria, polydipsia, polyphagia or blurred vision.  He did not want to talk about psychosocial issues.  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  ED Course: Initial vital signs were temperature 98 F, pulse 114, respirations 17, BP 133/87 mmHg O2 sat 87% on room air.  The patient received 1 mg of lorazepam for MRI imaging and 875 mg of Augmentin p.o. to cover aspiration.  Lab work: CBC showed a white count of 23.8, hemoglobin 14.4 g/dL platelets 226.  Normal PT, INR and PTT.  Alcohol level was normal.  Coronavirus and influenza PCR was negative.  CMP showed a sodium 134, potassium 4.5, chloride 103 and  CO2 21 mmol/L.  Glucose 239, BUN 27, creatinine 1.41 and calcium 8.8 mg/dL.  Baseline creatinine is longer 1.0 mg/dL.  AST was 300 and ALT 87 units/L.  The rest of the hepatic functions were normal.  Imaging: CT angio head and neck did not show any large vessel occlusion.  MRI Forei 14 body was negative.  Sacrum/coccyx x-ray was negative.  CT angio head and neck did not show any large vessel occlusion.  There were patchy groundglass density in the included upper lobe suspicious for pneumonia.  MRI of the brain shows small fossae of cytotoxic edema in the basal ganglia bilaterally.  MRI of the cervical spine show minimal cervical spondylosis without evidence of significant stenosis.  Review of Systems: As per HPI otherwise all other systems reviewed and are negative.  Past Medical History:  Diagnosis Date   Drug abuse [facility])    History reviewed. No pertinent surgical history.  Social History  reports that he has been smoking cigarettes. He has a 10.00 pack-year smoking history. He has never used smokeless tobacco. He reports current alcohol use of about 28.0 standard drinks per week. He reports current drug use. Drug: Cocaine.  No Known Allergies  Family History  Problem Relation Age of Onset   Bipolar disorder Mother    Atrial fibrillation Mother    Bipolar disorder Father    Congestive Heart Failure Maternal Grandmother    Heart disease Maternal Aunt    Heart disease Maternal Aunt    Ovarian cancer Maternal Aunt  Atrial fibrillation Maternal Aunt    Prior to Admission medications   Medication Sig Start Date End Date Taking? Authorizing Provider  HYDROcodone-acetaminophen (HYCET) 7.5-325 mg/15 ml solution Take 10 mLs by mouth every 4 (four) hours as needed for pain. Patient not taking: Reported on 12/04/2020 10/21/12   Clayton Bibles, PA-C  ibuprofen (ADVIL,MOTRIN) 800 MG tablet Take 1 tablet (800 mg total) by mouth 3 (three) times daily. Patient not taking: Reported on 12/04/2020  10/21/12   Clayton Bibles, [place]   Physical Exam: Vitals:   12/04/20 1000 12/04/20 1242 12/04/20 1243 12/04/20 1245  BP: 131/85 (!) 155/109  (!) 138/95  Pulse: 82  85 89  Resp: 15  18 18   Temp:      TempSrc:      SpO2: 100%  100% 100%   Constitutional: NAD, calm, comfortable Eyes: PERRL, lids and conjunctivae normal ENMT: Mucous membranes are moist. Posterior pharynx clear of any exudate or lesions. Neck: normal, supple, no masses, no thyromegaly Respiratory: clear to auscultation bilaterally, no wheezing, no crackles. Normal respiratory effort. No accessory muscle use.  Cardiovascular: Regular rate and rhythm, no murmurs / rubs / gallops. No extremity edema. 2+ pedal pulses. No carotid bruits.  Abdomen: No distention.  Bowel sounds positive.  Soft, no tenderness, no masses palpated. No hepatosplenomegaly. .  Musculoskeletal: no clubbing / cyanosis. Good ROM, no contractures. Normal muscle tone.  Skin: no rashes, lesions, ulcers on limited dermatological semination. Neurologic: CN 2-12 grossly intact.  Subjective decrease in sensation on right side extremities, 3/5 RUE and 4/5 RLE weakness. Psychiatric: Normal judgment and insight. Alert and oriented x 3. Normal mood.   Labs on Admission: I have personally reviewed following labs and imaging studies  CBC: Recent Labs  Lab 12/04/20 0826 12/04/20 0853  WBC  --  23.8*  NEUTROABS  --  21.7*  HGB 15.3 14.4  HCT 45.0 42.7  MCV  --  93.4  PLT  --  [id number]    Basic Metabolic Panel: Recent Labs  Lab 12/04/20 0826 12/04/20 0853  NA 136 134*  K 4.7 4.5  CL 104 103  CO2  --  21*  GLUCOSE 128* 139*  BUN 30* 27*  CREATININE 1.50* 1.41*  CALCIUM  --  8.8*    GFR: CrCl cannot be calculated (Unknown ideal weight.).  Liver Function Tests: Recent Labs  Lab 12/04/20 0853  AST 301*  ALT 87*  ALKPHOS 42  BILITOT 0.6  PROT 7.1  ALBUMIN 4.5    Urine analysis: No results found for: COLORURINE, APPEARANCEUR, LABSPEC, [place],  GLUCOSEU, HGBUR, BILIRUBINUR, KETONESUR, PROTEINUR, UROBILINOGEN, NITRITE, LEUKOCYTESUR  Radiological Exams on Admission: CT ANGIO HEAD NECK W WO CM  Result Date: 12/04/2020 CLINICAL DATA:  Neuro deficit, acute, stroke suspected; right-sided weakness EXAM: CT ANGIOGRAPHY HEAD AND NECK CT PERFUSION BRAIN TECHNIQUE: Multidetector CT imaging of the head and neck was performed using the standard protocol during bolus administration of intravenous contrast. Multiplanar CT image reconstructions and MIPs were obtained to evaluate the vascular anatomy. Carotid stenosis measurements (when applicable) are obtained utilizing NASCET criteria, using the distal internal carotid diameter as the denominator. Multiphase CT imaging of the brain was performed following IV bolus contrast injection. Subsequent parametric perfusion maps were calculated using RAPID software. CONTRAST:  152mL OMNIPAQUE IOHEXOL 350 MG/ML SOLN COMPARISON:  None. FINDINGS: CTA NECK Aortic arch: Included thoracic aorta is unremarkable. Great vessel origins are patent. Right carotid system: Patent. No stenosis or evidence of dissection. Left carotid system: Patent.  No stenosis  or evidence of dissection. Vertebral arteries: Patent. Left vertebral is dominant. No stenosis or evidence of dissection. Skeleton: No acute abnormality. Degenerative changes at the left temporomandibular joint. Other neck: Asymmetric left supraclavicular and cervical lymph nodes are probably reactive. Upper chest: Patchy ground-glass density in the included upper lobes, left greater than right. Review of the MIP images confirms the above findings CTA HEAD Anterior circulation: Intracranial internal carotid arteries are patent. Anterior and middle cerebral arteries are patent. Posterior circulation: Intracranial vertebral arteries, basilar artery, and posterior cerebral arteries are patent. Bilateral posterior communicating arteries are present. Venous sinuses: Patent as allowed by  contrast bolus timing. Review of the MIP images confirms the above findings CT Brain Perfusion Findings: CBF (<30%) Volume: 69mL Perfusion (Tmax>6.0s) volume: 84mL Mismatch Volume: 7mL Infarction Location: None. IMPRESSION: No large vessel occlusion, hemodynamically significant stenosis, or evidence of dissection. Perfusion imaging demonstrates no evidence of core infarction or penumbra. Patchy ground-glass density in the included upper lobes suspicious for pneumonia. Asymmetric nonenlarged left supraclavicular and cervical lymph nodes are probably reactive. Electronically Signed   By: Guadlupe Spanish M.D.   On: 12/04/2020 09:29   DG Eye Foreign Body  Result Date: 12/04/2020 CLINICAL DATA:  Metal working/exposure; clearance prior to MRI EXAM: ORBITS FOR FOREIGN BODY - 2 VIEW COMPARISON:  It CT 12/04/2020 FINDINGS: There is no evidence of metallic foreign body within the orbits. No significant bone abnormality identified. IMPRESSION: No evidence of metallic foreign body within the orbits. Electronically Signed   By: Rudie Meyer M.D.   On: 12/04/2020 11:16   DG Sacrum/Coccyx  Result Date: 12/04/2020 CLINICAL DATA:  Syncope, tailbone pain EXAM: SACRUM AND COCCYX - 2+ VIEW COMPARISON:  None. FINDINGS: Contrast in the bladder from a prior CT obscures the sacrum and coccyx on the AP projection. Within this confines, there is no evidence of acute fracture in the sacrum or coccyx. There is no other osseous abnormality. The SI joints and symphysis pubis are intact. The soft tissues are unremarkable. IMPRESSION: Contrast in the bladder obscures the sacrum and coccyx on the AP projection. Within this confines, no evidence of acute injury. Electronically Signed   By: Lesia Hausen M.D.   On: 12/04/2020 09:28   CT Head Wo Contrast  Result Date: 12/04/2020 CLINICAL DATA:  30 year old male unresponsive, lethargy and right side weakness after fentanyl use. EXAM: CT HEAD WITHOUT CONTRAST TECHNIQUE: Contiguous axial  images were obtained from the base of the skull through the vertex without intravenous contrast. COMPARISON:  Head CT 08/23/2010. FINDINGS: Brain: Normal cerebral volume. No midline shift, ventriculomegaly, mass effect, evidence of mass lesion, intracranial hemorrhage or evidence of cortically based acute infarction. Gray-white matter differentiation is within normal limits throughout the brain. Vascular: No suspicious intracranial vascular hyperdensity. Skull: Negative. Sinuses/Orbits: Trace fluid level left maxillary sinus. Other visualized paranasal sinuses and mastoids are clear. Other: Visualized orbits and scalp soft tissues are within normal limits. IMPRESSION: 1. Normal noncontrast CT appearance of the brain. 2. Trace left maxillary sinus fluid level. Electronically Signed   By: Odessa Fleming M.D.   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 Right-sided weakness and numbness. Fentanyl use. EXAM: MRI HEAD WITHOUT CONTRAST MRI CERVICAL SPINE WITHOUT CONTRAST TECHNIQUE: Multiplanar, multiecho pulse sequences of the brain and surrounding structures, and cervical spine, to include the craniocervical junction and cervicothoracic junction, were obtained without intravenous contrast. COMPARISON:  Head CT, head and  neck CTA, and CT head perfusion 12/04/2020. FINDINGS: MRI HEAD FINDINGS The study is mildly motion degraded. Brain: There are subcentimeter regions of restricted diffusion and T2 hyperintensity bilaterally in the globi pallidi, and there is also a punctate focus of restricted diffusion at the posterior aspect of the right putamen. No definite restricted diffusion is evident in the cerebral cortex or cerebellum. There are a few scattered punctate foci of T2 hyperintensity in the cerebral white matter bilaterally suggesting nonspecific gliosis. The ventricles and sulci are normal. No intracranial  hemorrhage, mass, midline shift, or extra-axial fluid collection is identified. Vascular: Major intracranial vascular flow voids are preserved. Skull and upper cervical spine: Unremarkable bone marrow signal para Sinuses/Orbits: Unremarkable orbits. Small mucous retention cyst in the left maxillary sinus. Clear mastoid air cells. Other: None. MRI CERVICAL SPINE FINDINGS The study is mildly motion degraded. Alignment: Normal. Vertebrae: No fracture, suspicious marrow lesion, or significant marrow edema. Cord: Normal signal and morphology. Posterior Fossa, vertebral arteries, paraspinal tissues: Unremarkable. Disc levels: C2-3: Mild right uncovertebral spurring without stenosis. C3-4: Tiny central disc protrusion and minimal right uncovertebral spurring without stenosis. C4-5: Minimal disc bulging and left uncovertebral spurring result in borderline left neural foraminal stenosis without spinal stenosis. C5-6: Tiny central disc protrusion and minimal uncovertebral spurring without stenosis. C6-7: Minimal disc bulging and minimal uncovertebral spurring without stenosis. C7-T1: Negative. IMPRESSION: 1. Small foci of cytotoxic edema in the basal ganglia bilaterally, nonspecific but potentially secondary to toxin exposure/drug abuse with this history. Other considerations include metabolic disturbances, acute lacunar infarcts, vasculitis, infection, and hypoxic-ischemic injury. 2. Minimal cervical spondylosis without evidence of significant stenosis. Normal appearance of the cervical spinal cord. Electronically Signed   By: Logan Bores M.D.   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 Right-sided weakness and numbness. Fentanyl use. EXAM: MRI HEAD WITHOUT CONTRAST MRI CERVICAL SPINE WITHOUT CONTRAST TECHNIQUE: Multiplanar, multiecho pulse sequences of the brain and surrounding structures, and cervical  spine, to include the craniocervical junction and cervicothoracic junction, were obtained without intravenous contrast. COMPARISON:  Head CT, head and neck CTA, and CT head perfusion 12/04/2020. FINDINGS: MRI HEAD FINDINGS The study is mildly motion degraded. Brain: There are subcentimeter regions of restricted diffusion and T2 hyperintensity bilaterally in the globi pallidi, and there is also a punctate focus of restricted diffusion at the posterior aspect of the right putamen. No definite restricted diffusion is evident in the cerebral cortex or cerebellum. There are a few scattered punctate foci of T2 hyperintensity in the cerebral white matter bilaterally suggesting nonspecific gliosis. The ventricles and sulci are normal. No intracranial hemorrhage, mass, midline shift, or extra-axial fluid collection is identified. Vascular: Major intracranial vascular flow voids are preserved. Skull and upper cervical spine: Unremarkable bone marrow signal para Sinuses/Orbits: Unremarkable orbits. Small mucous retention cyst in the left maxillary sinus. Clear mastoid air cells. Other: None. MRI CERVICAL SPINE FINDINGS The study is mildly motion degraded. Alignment: Normal. Vertebrae: No fracture, suspicious marrow lesion, or significant marrow edema. Cord: Normal signal and morphology. Posterior Fossa, vertebral arteries, paraspinal tissues: Unremarkable. Disc levels: C2-3: Mild right uncovertebral spurring without stenosis. C3-4: Tiny central disc protrusion and minimal right uncovertebral spurring without stenosis. C4-5: Minimal disc bulging and left uncovertebral spurring result in borderline left neural foraminal stenosis without spinal stenosis. C5-6: Tiny central disc protrusion and minimal uncovertebral spurring without stenosis. C6-7: Minimal disc bulging and minimal uncovertebral spurring without  stenosis. C7-T1: Negative. IMPRESSION: 1. Small foci of cytotoxic edema in the basal ganglia bilaterally, nonspecific but  potentially secondary to toxin exposure/drug abuse with this history. Other considerations include metabolic disturbances, acute lacunar infarcts, vasculitis, infection, and hypoxic-ischemic injury. 2. Minimal cervical spondylosis without evidence of significant stenosis. Normal appearance of the cervical spinal cord. Electronically Signed   By: Logan Bores M.D.   On: 12/04/2020 13:03   CT CEREBRAL PERFUSION W CONTRAST  Result Date: 12/04/2020 CLINICAL DATA:  Neuro deficit, acute, stroke suspected; right-sided weakness EXAM: CT ANGIOGRAPHY HEAD AND NECK CT PERFUSION BRAIN TECHNIQUE: Multidetector CT imaging of the head and neck was performed using the standard protocol during bolus administration of intravenous contrast. Multiplanar CT image reconstructions and MIPs were obtained to evaluate the vascular anatomy. Carotid stenosis measurements (when applicable) are obtained utilizing NASCET criteria, using the distal internal carotid diameter as the denominator. Multiphase CT imaging of the brain was performed following IV bolus contrast injection. Subsequent parametric perfusion maps were calculated using RAPID software. CONTRAST:  174mL OMNIPAQUE IOHEXOL 350 MG/ML SOLN COMPARISON:  None. FINDINGS: CTA NECK Aortic arch: Included thoracic aorta is unremarkable. Great vessel origins are patent. Right carotid system: Patent. No stenosis or evidence of dissection. Left carotid system: Patent.  No stenosis or evidence of dissection. Vertebral arteries: Patent. Left vertebral is dominant. No stenosis or evidence of dissection. Skeleton: No acute abnormality. Degenerative changes at the left temporomandibular joint. Other neck: Asymmetric left supraclavicular and cervical lymph nodes are probably reactive. Upper chest: Patchy ground-glass density in the included upper lobes, left greater than right. Review of the MIP images confirms the above findings CTA HEAD Anterior circulation: Intracranial internal carotid  arteries are patent. Anterior and middle cerebral arteries are patent. Posterior circulation: Intracranial vertebral arteries, basilar artery, and posterior cerebral arteries are patent. Bilateral posterior communicating arteries are present. Venous sinuses: Patent as allowed by contrast bolus timing. Review of the MIP images confirms the above findings CT Brain Perfusion Findings: CBF (<30%) Volume: 19mL Perfusion (Tmax>6.0s) volume: 72mL Mismatch Volume: 62mL Infarction Location: None. IMPRESSION: No large vessel occlusion, hemodynamically significant stenosis, or evidence of dissection. Perfusion imaging demonstrates no evidence of core infarction or penumbra. Patchy ground-glass density in the included upper lobes suspicious for pneumonia. Asymmetric nonenlarged left supraclavicular and cervical lymph nodes are probably reactive. Electronically Signed   By: Macy Mis M.D.   On: 12/04/2020 09:29    EKG: Independently reviewed.   Assessment/Plan Principal Problem:   Right sided weakness Observation/telemetry. Frequent neurochecks. Swallow screen evaluation. Consult physical and occupational therapy. Check fasting lipids and glucose level. Check echocardiogram. Neurology consult appreciated.  Active Problems:   AKI (acute kidney injury) ([place]) LR 2000 mL bolus. Continue LR 125 mL/h. Monitor intake and output. Avoid nephrotoxins. Avoid hypotension.   Follow renal function electrolytes.    Abnormal LFTs Check acute hepatitis panel. Repeat hepatic function panel in AM.    Hypocalcemia Recheck calcium and albumin level in AM.    Polysubstance abuse ([place]) Refer to TOC.    Alcohol abuse No signs of alcohol withdrawal. Begin MedSurg CIWA protocol if needed.    DVT prophylaxis: Lovenox SQ. Code Status:   Full code. Family Communication:  His mother was at bedside. Disposition Plan:   Patient is from:  Home.  Anticipated DC to:  Home.  Anticipated DC  date:  12/05/2020.  Anticipated DC barriers: Clinical status.  Consults called:  Neurology (Dr. Leonel Ramsay). Admission status:  Observation/telemetry.  Severity of Illness:  High severity  after presenting initially with an opioid overdose and subsequently waking up with right-sided weakness, particularly of the right upper extremity.  Reubin Milan MD [facility]  How to contact the [facility] Attending or Consulting provider [place] or covering provider during after hours [place], for this patient?   Check the care team in [facility] and look for a) attending/consulting [facility] provider listed and b) the [facility] team listed Log into [email or web address] and use 's universal password to access. If you do not have the password, please contact the hospital operator. Locate the [facility] provider you are looking for under [facility] and page to a number that you can be directly reached. If you still have difficulty reaching the provider, please page the [facility] (Director on Call) for the Hospitalists listed on amion for assistance.  12/04/2020, 3:20 PM   This document was prepared using [employer] and may contain some unintended transcription errors.

## 2020-12-04 NOTE — Discharge Instructions (Addendum)
Outpatient Substance Use Treatment Services   [facility] Intensive Outpatient Program [address] [place], [place] [address]  [phone] Private insurance, Medicare A&B, and GCCN   [facility]  [address]  [place], [place] [address] [phone] Medicaid, Self Pay   [facility]      [address]  [place], [place] [phone] Medicaid and Private Insurance, Self Pay   [facility] [address]  [place], [place]  [phone] Private Insurance, and Self Pay  [facility]      [address]    [place], [place] [address]  800-659-3381 or [phone] Private Insurance Only                 [facility] [address]  [place], [place] [address] [phone] Medicaid, Medicare, Private Insurance  [facility] [address]  [place], [place] [address] [phone] Services are free or reduced  [facility]  [address] [phone]  Self Pay only, sliding scale  [facility]  [address]  [place], [place] [address] [phone] ([facility]) Self Pay, Medicaid Only   [facility] [address]  [place], [place] [address] [phone] Medicaid, Medicare, Private Insurance                     Adolescent Substance Use Treatment Services    [facility] [address]  [place], [place]  [phone] Self Pay Offer scholarships from the [employer] to help pay for treatment  Website: www.[facility]ight[facility].com  [facility] Males ages: 12-17 Adolescent Substance use Program Females: 12-17  [place] [facility] [address]  [place], [place] [address] (ph) [phone]  (fax) [phone]  [place]  Office  [address]  [place], [place] [address] (ph) [phone]  (fax) [phone]  [place] [facility] [address]  [place], [place] [address] (ph) [phone]  (fax) [phone]  [place] Office [address], [place], [place] [address] (ph) [phone]   (fax) [phone]  Website: [email or web address].com/         [facility] Abuse Intensive Outpatient Program for Adolescents Phone: [phone] Address: [address], [place], [place] Website: [email or web address]    Residential Substance Use Treatment Services   [facility]  [address]  [place], [place] [address]  [phone] or [phone] Detox (Medicare, Medicaid, private insurance, and self pay)  Residential Rehab 14 days (Medicare, Medicaid, private insurance, and self pay)   [facility]  [address] [place], [place]  [phone]  Male and Male Detox (Self Pay and Medicaid limited availability)  Rehab only Male (Medicaid and self pay only)   [facility]      [address]  [place],  [address]  [phone] or [phone] Detox and Residential Treatment Private Insurance Only   [facility]  [address]  [place],  [address]  [phone]  Treatment Only, must make assessment appointment, and must be sober for assessment appointment.  Self Pay Only, Medicare A&B, [place] Medicaid, [place] ID only! *Transportation assistance offered from [employer] [address]  [facility]     [address] [place],  [address] Walk in interviews M-Sat 8-4p No   pending legal charges [phone]     [facility]:  [facility] Referral  [address] [place], [place] [phone] (Self Pay, Medicaid)  [facility] [address] [place], [place] [address] [phone] Detox and Residential Treatment Medicare and Private Insurance  [facility] [address]  [place], [place] [address] 28 Day Women's Facility: [phone] 28 Day Men's Facility: [phone] Long-term Residential Program:  [phone] Males 25 and Over (No Insurance, upfront fee)  [facility]  [address] [place], [place] [address] [phone] Private Insurance with [employer], Private Pay  [facility] [address] [place], [place] [address] Local [phone] Private Insurance Only  [facility] [address]  [place], [place] [address]  [phone] (Males, upfront fee)  [facility] [place] [address]  [place], [address] [phone] Private Insurance      [facility] Locations  [facility]  [address]  [place], [place]  [phone] Christian Based Program for individuals experiencing homelessness Self Pay, No insurance  [facility]  Men's program: [facility] [address]  [place], [place] [address] [phone]  [facility] Women's program: [facility] [address] [place], [place] [address] [phone] Christian Based Program for individuals experiencing homelessness Self Pay, No insurance  [facility] [address]  [place], [place] [address]  [phone] Christian Based Program for individuals experiencing homelessness Self Pay, No insurance  [facility] [address]  [place],  [address] [phone] Christian Based Program for individuals experiencing homelessness Self Pay, No insurance  [facility] [address] [place],  [phone] Christian Based Program for males experiencing homelessness Self Pay, No insurance 

## 2020-12-04 NOTE — ED Notes (Signed)
Pt c/o right arm/leg numbness.  Pt states last thing he remember is being in the hotel at approximately 2000 on 12/03/20 w/ full sensation and movement to right side of body.  Pt's right arm has no effort against gravity.  Pt ambulated to the bathroom, noticeable shuffling of right leg.  Pt unsteady needing stand by assist as well as using counter for support. Abbigail, PA aware and placing additional orders.

## 2020-12-04 NOTE — ED Notes (Signed)
ED TO INPATIENT HANDOFF REPORT  ED Nurse Name and Phone #: [phone] Erick Colace, RN   S Name/Age/Gender Jeffery Hudson 30 y.o. male Room/Bed: WA14/WA14  Code Status   Code Status: Full Code  Home/SNF/Other Home Patient oriented to: self, place, time, and situation Is this baseline? Yes   Triage Complete: Triage complete  Chief Complaint Right sided weakness [R53.1]  Triage Note EMS called for overdose. Patient was in respiratory distress and unresponsive with sternal rubs. Patient received 1.5mg  narcan IM, .5 narcan IV. Patient was lethargic after and remains lethargic upon arrival. EMA also gave 10mg  albuterol,  atrovent, 125mg  solumedrol.    Allergies No Known Allergies  Level of Care/Admitting Diagnosis ED Disposition     ED Disposition  Admit   Condition  --   Comment  Hospital Area: [facility] [id number]  Level of Care: Telemetry [5]  Admit to tele based on following criteria: Monitor for Ischemic changes  May place patient in observation at [facility] or [place] if equivalent level of care is available:: No  Covid Evaluation: Confirmed COVID Negative  Diagnosis: Right sided weakness [id number]  Admitting Physician: Jeffery Hudson [id number]  Attending Physician: Jeffery Hudson [id number]          B Medical/Surgery History Past Medical History:  Diagnosis Date   Drug abuse ([place])    History reviewed. No pertinent surgical history.   A IV Location/Drains/Wounds Patient Lines/Drains/Airways Status     Active Line/Drains/Airways     Name Placement date Placement time Site Days   Peripheral IV 12/04/20 18 G Anterior;Distal;Right;Upper Arm 12/04/20  0240  Arm  less than 1   Peripheral IV 12/04/20 20 G 1" Left Antecubital 12/04/20  0900  Antecubital  less than 1            Intake/Output Last 24 hours  Intake/Output Summary (Last 24 hours) at 12/04/2020 2305 Last data filed at 12/04/2020 2236 Gross per 24 hour   Intake 2000 ml  Output --  Net 2000 ml    Labs/Imaging Results for orders placed or performed during the hospital encounter of 12/04/20 (from the past 48 hour(s))  Urine rapid drug screen (hosp performed)     Status: Abnormal   Collection Time: 12/04/20  8:03 AM  Result Value Ref Range   Opiates NONE DETECTED NONE DETECTED   Cocaine POSITIVE (A) NONE DETECTED   Benzodiazepines NONE DETECTED NONE DETECTED   Amphetamines NONE DETECTED NONE DETECTED   Tetrahydrocannabinol POSITIVE (A) NONE DETECTED   Barbiturates NONE DETECTED NONE DETECTED    Comment: (NOTE) DRUG SCREEN FOR MEDICAL PURPOSES ONLY.  IF CONFIRMATION IS NEEDED FOR ANY PURPOSE, NOTIFY LAB WITHIN 5 DAYS.  LOWEST DETECTABLE LIMITS FOR URINE DRUG SCREEN Drug Class                     Cutoff (ng/mL) Amphetamine and metabolites    1000 Barbiturate and metabolites    200 Benzodiazepine                 [id number] Tricyclics and metabolites     300 Opiates and metabolites        300 Cocaine and metabolites        300 THC                            50 Performed at [facility], [place] [address], [place], [place] [address]  Urinalysis, Routine w reflex microscopic     Status: Abnormal   Collection Time: 12/04/20  8:03 AM  Result Value Ref Range   Color, Urine COLORLESS (A) YELLOW   APPearance CLEAR CLEAR   Specific Gravity, Urine 1.004 (L) 1.005 - 1.030   pH 6.0 5.0 - 8.0   Glucose, UA NEGATIVE NEGATIVE mg/dL   Hgb urine dipstick NEGATIVE NEGATIVE   Bilirubin Urine NEGATIVE NEGATIVE   Ketones, ur NEGATIVE NEGATIVE mg/dL   Protein, ur NEGATIVE NEGATIVE mg/dL   Nitrite NEGATIVE NEGATIVE   Leukocytes,Ua NEGATIVE NEGATIVE    Comment: Performed at [place] [address], [place], [place] [address]  [name] 8, ED     Status: Abnormal   Collection Time: 12/04/20  8:26 AM  Result Value Ref Range   Sodium 136 135 - 145 mmol/L   Potassium 4.7 3.5 - 5.1 mmol/L   Chloride 104 98 -  111 mmol/L   BUN 30 (H) 6 - 20 mg/dL   Creatinine, Ser 1.50 (H) 0.61 - 1.24 mg/dL   Glucose, Bld 128 (H) 70 - 99 mg/dL    Comment: Glucose reference range applies only to samples taken after fasting for at least 8 hours.   Calcium, Ion 1.15 1.15 - 1.40 mmol/L   TCO2 24 22 - 32 mmol/L   Hemoglobin 15.3 13.0 - 17.0 g/dL   HCT 45.0 39.0 - 52.0 %  Ethanol     Status: None   Collection Time: 12/04/20  8:53 AM  Result Value Ref Range   Alcohol, Ethyl (B) <10 <10 mg/dL    Comment: (NOTE) Lowest detectable limit for serum alcohol is 10 mg/dL.  For medical purposes only. Performed at [facility], [place] [address], [place], [place] [address]   Protime-INR     Status: None   Collection Time: 12/04/20  8:53 AM  Result Value Ref Range   Prothrombin Time 12.9 11.4 - 15.2 seconds   INR 1.0 0.8 - 1.2    Comment: (NOTE) INR goal varies based on device and disease states. Performed at [facility], [place] [address] [place], [place] [address]   APTT     Status: None   Collection Time: 12/04/20  8:53 AM  Result Value Ref Range   aPTT 27 24 - 36 seconds    Comment: Performed at [facility], [place] [address], [place], [place] [address]  CBC     Status: Abnormal   Collection Time: 12/04/20  8:53 AM  Result Value Ref Range   WBC 23.8 (H) 4.0 - 10.5 K/uL   RBC 4.57 4.22 - 5.81 MIL/uL   Hemoglobin 14.4 13.0 - 17.0 g/dL   HCT 42.7 39.0 - 52.0 %   MCV 93.4 80.0 - 100.0 fL   MCH 31.5 26.0 - 34.0 pg   MCHC 33.7 30.0 - 36.0 g/dL   RDW 13.9 11.5 - 15.5 %   Platelets 284 150 - 400 K/uL   nRBC 0.0 0.0 - 0.2 %    Comment: Performed at [facility], [place] [address], [place],  [address]  Differential     Status: Abnormal   Collection Time: 12/04/20  8:53 AM  Result Value Ref Range   Neutrophils Relative % 92 %   Neutro Abs 21.7 (H) 1.7 - 7.7 K/uL   Lymphocytes Relative 4 %   Lymphs Abs 1.1 0.7 - 4.0 K/uL   Monocytes  Relative 2 %   Monocytes Absolute  0.6 0.1 - 1.0 K/uL   Eosinophils Relative 0 %   Eosinophils Absolute 0.0 0.0 - 0.5 K/uL   Basophils Relative 0 %   Basophils Absolute 0.1 0.0 - 0.1 K/uL   Immature Granulocytes 2 %   Abs Immature Granulocytes 0.41 (H) 0.00 - 0.07 K/uL    Comment: Performed at [facility], [place] [address], [place], [place] [address]  Comprehensive metabolic panel     Status: Abnormal   Collection Time: 12/04/20  8:53 AM  Result Value Ref Range   Sodium 134 (L) 135 - 145 mmol/L   Potassium 4.5 3.5 - 5.1 mmol/L   Chloride 103 98 - 111 mmol/L   CO2 21 (L) 22 - 32 mmol/L   Glucose, Bld 139 (H) 70 - 99 mg/dL    Comment: Glucose reference range applies only to samples taken after fasting for at least 8 hours.   BUN 27 (H) 6 - 20 mg/dL   Creatinine, Ser 1.41 (H) 0.61 - 1.24 mg/dL   Calcium 8.8 (L) 8.9 - 10.3 mg/dL   Total Protein 7.1 6.5 - 8.1 g/dL   Albumin 4.5 3.5 - 5.0 g/dL   AST 301 (H) 15 - 41 U/L   ALT 87 (H) 0 - 44 U/L   Alkaline Phosphatase 42 38 - 126 U/L   Total Bilirubin 0.6 0.3 - 1.2 mg/dL   GFR, Estimated >60 >60 mL/min    Comment: (NOTE) Calculated using the CKD-EPI Creatinine Equation (2021)    Anion gap 10 5 - 15    Comment: Performed at [facility], [place] [address], [place], [place] [address]  Resp Panel by RT-PCR (Flu A&B, Covid) Nasopharyngeal Swab     Status: None   Collection Time: 12/04/20 12:42 PM   Specimen: Nasopharyngeal Swab; Nasopharyngeal(NP) swabs in vial transport medium  Result Value Ref Range   SARS Coronavirus 2 by RT PCR NEGATIVE NEGATIVE    Comment: (NOTE) SARS-CoV-2 target nucleic acids are NOT DETECTED.  The SARS-CoV-2 RNA is generally detectable in upper respiratory specimens during the acute phase of infection. The lowest concentration of SARS-CoV-2 viral copies this assay can detect is 138 copies/mL. A negative result does not preclude SARS-Cov-2 infection and should not be used as the  sole basis for treatment or other patient management decisions. A negative result may occur with  improper specimen collection/handling, submission of specimen other than nasopharyngeal swab, presence of viral mutation(s) within the areas targeted by this assay, and inadequate number of viral copies(<138 copies/mL). A negative result must be combined with clinical observations, patient history, and epidemiological information. The expected result is Negative.  Fact Sheet for Patients:  EntrepreneurPulse.com.au  Fact Sheet for Healthcare Providers:  IncredibleEmployment.be  This test is no t yet approved or cleared by the [place] FDA and  has been authorized for detection and/or diagnosis of SARS-CoV-2 by FDA under an Emergency Use Authorization (EUA). This EUA will remain  in effect (meaning this test can be used) for the duration of the COVID-19 declaration under Section 564(b)(1) of the Act, 21 U.S.C.section 360bbb-3(b)(1), unless the authorization is terminated  or revoked sooner.       Influenza A by PCR NEGATIVE NEGATIVE   Influenza B by PCR NEGATIVE NEGATIVE    Comment: (NOTE) The Xpert Xpress SARS-CoV-2/FLU/RSV plus assay is intended as an aid in the diagnosis of influenza from Nasopharyngeal swab specimens and should not be used as a sole basis for treatment. Nasal washings and aspirates are  unacceptable for Xpert Xpress SARS-CoV-2/FLU/RSV testing.  Fact Sheet for Patients: EntrepreneurPulse.com.au  Fact Sheet for Healthcare Providers: IncredibleEmployment.be  This test is not yet approved or cleared by the [place] FDA and has been authorized for detection and/or diagnosis of SARS-CoV-2 by FDA under an Emergency Use Authorization (EUA). This EUA will remain in effect (meaning this test can be used) for the duration of the COVID-19 declaration under Section 564(b)(1) of the Act, 21  U.S.C. section 360bbb-3(b)(1), unless the authorization is terminated or revoked.  Performed at [facility], [place] [address] [place], [place] [address]   Troponin I (High Sensitivity)     Status: Abnormal   Collection Time: 12/04/20  9:14 PM  Result Value Ref Range   Troponin I (High Sensitivity) 2,416 (HH) <18 ng/L    Comment:  Burnis Medin RN 12/04/20/ @ 2232 VS CRITICAL RESULT CALLED TO, READ BACK BY AND VERIFIED WITH: (NOTE) Elevated high sensitivity troponin I (hsTnI) values and significant  changes across serial measurements may suggest ACS but many other  chronic and acute conditions are known to elevate hsTnI results.  Refer to the Links section for chest pain algorithms and additional  guidance. Performed at [facility], [place] [address], [place], [place] [address]    CT ANGIO HEAD NECK W WO CM  Result Date: 12/04/2020 CLINICAL DATA:  Neuro deficit, acute, stroke suspected; right-sided weakness EXAM: CT ANGIOGRAPHY HEAD AND NECK CT PERFUSION BRAIN TECHNIQUE: Multidetector CT imaging of the head and neck was performed using the standard protocol during bolus administration of intravenous contrast. Multiplanar CT image reconstructions and MIPs were obtained to evaluate the vascular anatomy. Carotid stenosis measurements (when applicable) are obtained utilizing NASCET criteria, using the distal internal carotid diameter as the denominator. Multiphase CT imaging of the brain was performed following IV bolus contrast injection. Subsequent parametric perfusion maps were calculated using RAPID software. CONTRAST:  163mL OMNIPAQUE IOHEXOL 350 MG/ML SOLN COMPARISON:  None. FINDINGS: CTA NECK Aortic arch: Included thoracic aorta is unremarkable. Great vessel origins are patent. Right carotid system: Patent. No stenosis or evidence of dissection. Left carotid system: Patent.  No stenosis or evidence of dissection. Vertebral arteries: Patent. Left  vertebral is dominant. No stenosis or evidence of dissection. Skeleton: No acute abnormality. Degenerative changes at the left temporomandibular joint. Other neck: Asymmetric left supraclavicular and cervical lymph nodes are probably reactive. Upper chest: Patchy ground-glass density in the included upper lobes, left greater than right. Review of the MIP images confirms the above findings CTA HEAD Anterior circulation: Intracranial internal carotid arteries are patent. Anterior and middle cerebral arteries are patent. Posterior circulation: Intracranial vertebral arteries, basilar artery, and posterior cerebral arteries are patent. Bilateral posterior communicating arteries are present. Venous sinuses: Patent as allowed by contrast bolus timing. Review of the MIP images confirms the above findings CT Brain Perfusion Findings: CBF (<30%) Volume: 102mL Perfusion (Tmax>6.0s) volume: 73mL Mismatch Volume: 80mL Infarction Location: None. IMPRESSION: No large vessel occlusion, hemodynamically significant stenosis, or evidence of dissection. Perfusion imaging demonstrates no evidence of core infarction or penumbra. Patchy ground-glass density in the included upper lobes suspicious for pneumonia. Asymmetric nonenlarged left supraclavicular and cervical lymph nodes are probably reactive. Electronically Signed   By: Macy Mis M.D.   On: 12/04/2020 09:29   DG Eye Foreign Body  Result Date: 12/04/2020 CLINICAL DATA:  Metal working/exposure; clearance prior to MRI EXAM: ORBITS FOR FOREIGN BODY - 2 VIEW COMPARISON:  It CT 12/04/2020 FINDINGS: There is no evidence  of metallic foreign body within the orbits. No significant bone abnormality identified. IMPRESSION: No evidence of metallic foreign body within the orbits. Electronically Signed   By: Marijo Sanes M.D.   On: 12/04/2020 11:16   DG Sacrum/Coccyx  Result Date: 12/04/2020 CLINICAL DATA:  Syncope, tailbone pain EXAM: SACRUM AND COCCYX - 2+ VIEW COMPARISON:  None.  FINDINGS: Contrast in the bladder from a prior CT obscures the sacrum and coccyx on the AP projection. Within this confines, there is no evidence of acute fracture in the sacrum or coccyx. There is no other osseous abnormality. The SI joints and symphysis pubis are intact. The soft tissues are unremarkable. IMPRESSION: Contrast in the bladder obscures the sacrum and coccyx on the AP projection. Within this confines, no evidence of acute injury. Electronically Signed   By: Valetta Mole M.D.   On: 12/04/2020 09:28   CT Head Wo Contrast  Result Date: 12/04/2020 CLINICAL DATA:  30 year old male unresponsive, lethargy and right side weakness after fentanyl use. EXAM: CT HEAD WITHOUT CONTRAST TECHNIQUE: Contiguous axial images were obtained from the base of the skull through the vertex without intravenous contrast. COMPARISON:  Head CT 08/23/2010. FINDINGS: Brain: Normal cerebral volume. No midline shift, ventriculomegaly, mass effect, evidence of mass lesion, intracranial hemorrhage or evidence of cortically based acute infarction. Gray-white matter differentiation is within normal limits throughout the brain. Vascular: No suspicious intracranial vascular hyperdensity. Skull: Negative. Sinuses/Orbits: Trace fluid level left maxillary sinus. Other visualized paranasal sinuses and mastoids are clear. Other: Visualized orbits and scalp soft tissues are within normal limits. IMPRESSION: 1. Normal noncontrast CT appearance of the brain. 2. Trace left maxillary sinus fluid level. Electronically Signed   By: Genevie Ann M.D.   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 Right-sided weakness and numbness. Fentanyl use. EXAM: MRI HEAD WITHOUT CONTRAST MRI CERVICAL SPINE WITHOUT CONTRAST TECHNIQUE: Multiplanar, multiecho pulse sequences of the brain and surrounding structures, and cervical spine, to include the  craniocervical junction and cervicothoracic junction, were obtained without intravenous contrast. COMPARISON:  Head CT, head and neck CTA, and CT head perfusion 12/04/2020. FINDINGS: MRI HEAD FINDINGS The study is mildly motion degraded. Brain: There are subcentimeter regions of restricted diffusion and T2 hyperintensity bilaterally in the globi pallidi, and there is also a punctate focus of restricted diffusion at the posterior aspect of the right putamen. No definite restricted diffusion is evident in the cerebral cortex or cerebellum. There are a few scattered punctate foci of T2 hyperintensity in the cerebral white matter bilaterally suggesting nonspecific gliosis. The ventricles and sulci are normal. No intracranial hemorrhage, mass, midline shift, or extra-axial fluid collection is identified. Vascular: Major intracranial vascular flow voids are preserved. Skull and upper cervical spine: Unremarkable bone marrow signal para Sinuses/Orbits: Unremarkable orbits. Small mucous retention cyst in the left maxillary sinus. Clear mastoid air cells. Other: None. MRI CERVICAL SPINE FINDINGS The study is mildly motion degraded. Alignment: Normal. Vertebrae: No fracture, suspicious marrow lesion, or significant marrow edema. Cord: Normal signal and morphology. Posterior Fossa, vertebral arteries, paraspinal tissues: Unremarkable. Disc levels: C2-3: Mild right uncovertebral spurring without stenosis. C3-4: Tiny central disc protrusion and minimal right uncovertebral spurring without stenosis. C4-5: Minimal disc bulging and left uncovertebral spurring result in borderline left neural foraminal stenosis without spinal stenosis. C5-6: Tiny central disc protrusion and minimal uncovertebral spurring without stenosis. C6-7: Minimal disc bulging and minimal uncovertebral spurring without stenosis.  C7-T1: Negative. IMPRESSION: 1. Small foci of cytotoxic edema in the basal ganglia bilaterally, nonspecific but potentially secondary  to toxin exposure/drug abuse with this history. Other considerations include metabolic disturbances, acute lacunar infarcts, vasculitis, infection, and hypoxic-ischemic injury. 2. Minimal cervical spondylosis without evidence of significant stenosis. Normal appearance of the cervical spinal cord. Electronically Signed   By: Logan Bores M.D.   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 Right-sided weakness and numbness. Fentanyl use. EXAM: MRI HEAD WITHOUT CONTRAST MRI CERVICAL SPINE WITHOUT CONTRAST TECHNIQUE: Multiplanar, multiecho pulse sequences of the brain and surrounding structures, and cervical spine, to include the craniocervical junction and cervicothoracic junction, were obtained without intravenous contrast. COMPARISON:  Head CT, head and neck CTA, and CT head perfusion 12/04/2020. FINDINGS: MRI HEAD FINDINGS The study is mildly motion degraded. Brain: There are subcentimeter regions of restricted diffusion and T2 hyperintensity bilaterally in the globi pallidi, and there is also a punctate focus of restricted diffusion at the posterior aspect of the right putamen. No definite restricted diffusion is evident in the cerebral cortex or cerebellum. There are a few scattered punctate foci of T2 hyperintensity in the cerebral white matter bilaterally suggesting nonspecific gliosis. The ventricles and sulci are normal. No intracranial hemorrhage, mass, midline shift, or extra-axial fluid collection is identified. Vascular: Major intracranial vascular flow voids are preserved. Skull and upper cervical spine: Unremarkable bone marrow signal para Sinuses/Orbits: Unremarkable orbits. Small mucous retention cyst in the left maxillary sinus. Clear mastoid air cells. Other: None. MRI CERVICAL SPINE FINDINGS The study is mildly motion degraded. Alignment: Normal. Vertebrae: No fracture, suspicious  marrow lesion, or significant marrow edema. Cord: Normal signal and morphology. Posterior Fossa, vertebral arteries, paraspinal tissues: Unremarkable. Disc levels: C2-3: Mild right uncovertebral spurring without stenosis. C3-4: Tiny central disc protrusion and minimal right uncovertebral spurring without stenosis. C4-5: Minimal disc bulging and left uncovertebral spurring result in borderline left neural foraminal stenosis without spinal stenosis. C5-6: Tiny central disc protrusion and minimal uncovertebral spurring without stenosis. C6-7: Minimal disc bulging and minimal uncovertebral spurring without stenosis. C7-T1: Negative. IMPRESSION: 1. Small foci of cytotoxic edema in the basal ganglia bilaterally, nonspecific but potentially secondary to toxin exposure/drug abuse with this history. Other considerations include metabolic disturbances, acute lacunar infarcts, vasculitis, infection, and hypoxic-ischemic injury. 2. Minimal cervical spondylosis without evidence of significant stenosis. Normal appearance of the cervical spinal cord. Electronically Signed   By: Logan Bores M.D.   On: 12/04/2020 13:03   CT CEREBRAL PERFUSION W CONTRAST  Result Date: 12/04/2020 CLINICAL DATA:  Neuro deficit, acute, stroke suspected; right-sided weakness EXAM: CT ANGIOGRAPHY HEAD AND NECK CT PERFUSION BRAIN TECHNIQUE: Multidetector CT imaging of the head and neck was performed using the standard protocol during bolus administration of intravenous contrast. Multiplanar CT image reconstructions and MIPs were obtained to evaluate the vascular anatomy. Carotid stenosis measurements (when applicable) are obtained utilizing NASCET criteria, using the distal internal carotid diameter as the denominator. Multiphase CT imaging of the brain was performed following IV bolus contrast injection. Subsequent parametric perfusion maps were calculated using RAPID software. CONTRAST:  176mL OMNIPAQUE IOHEXOL 350 MG/ML SOLN COMPARISON:  None.  FINDINGS: CTA NECK Aortic arch: Included thoracic aorta is unremarkable. Great vessel origins are patent. Right carotid system: Patent. No stenosis or evidence of dissection. Left carotid system: Patent.  No stenosis or evidence of dissection. Vertebral arteries: Patent. Left vertebral is dominant. No  stenosis or evidence of dissection. Skeleton: No acute abnormality. Degenerative changes at the left temporomandibular joint. Other neck: Asymmetric left supraclavicular and cervical lymph nodes are probably reactive. Upper chest: Patchy ground-glass density in the included upper lobes, left greater than right. Review of the MIP images confirms the above findings CTA HEAD Anterior circulation: Intracranial internal carotid arteries are patent. Anterior and middle cerebral arteries are patent. Posterior circulation: Intracranial vertebral arteries, basilar artery, and posterior cerebral arteries are patent. Bilateral posterior communicating arteries are present. Venous sinuses: Patent as allowed by contrast bolus timing. Review of the MIP images confirms the above findings CT Brain Perfusion Findings: CBF (<30%) Volume: 63mL Perfusion (Tmax>6.0s) volume: 58mL Mismatch Volume: 57mL Infarction Location: None. IMPRESSION: No large vessel occlusion, hemodynamically significant stenosis, or evidence of dissection. Perfusion imaging demonstrates no evidence of core infarction or penumbra. Patchy ground-glass density in the included upper lobes suspicious for pneumonia. Asymmetric nonenlarged left supraclavicular and cervical lymph nodes are probably reactive. Electronically Signed   By: Guadlupe Spanish M.D.   On: 12/04/2020 09:29   DG CHEST PORT 1 VIEW  Result Date: 12/04/2020 CLINICAL DATA:  Aspiration into airway EXAM: PORTABLE CHEST 1 VIEW COMPARISON:  None. FINDINGS: The heart size and mediastinal contours are within normal limits. Both lungs are clear. The visualized skeletal structures are unremarkable. IMPRESSION:  Negative. Electronically Signed   By: Charlett Nose M.D.   On: 12/04/2020 17:42    Pending Labs Unresulted Labs (From admission, onward)     Start     Ordered   12/11/20 0500  Creatinine, serum  (enoxaparin (LOVENOX)    CrCl >/= 30 ml/min)  Weekly,   R     Comments: while on enoxaparin therapy    12/04/20 1451   12/05/20 0500  Hemoglobin A1c  (Labs)  Tomorrow morning,   R        12/04/20 1451   12/05/20 0500  Lipid panel  (Labs)  Tomorrow morning,   R       Comments: Fasting    12/04/20 1451   12/05/20 0500  HIV Antibody (routine testing w rflx)  (HIV Antibody (Routine testing w reflex) panel)  Tomorrow morning,   R        12/04/20 1451   12/05/20 0500  Hepatitis panel, acute  Tomorrow morning,   R        12/04/20 1451            Vitals/Pain Today's Vitals   12/04/20 2030 12/04/20 2100 12/04/20 2105 12/04/20 2200  BP: (!) 201/147 (!) 209/119 (!) 140/94 (!) 140/100  Pulse: 80 77 87 71  Resp: 14 15 17 14   Temp:      TempSrc:      SpO2: 97% 97% 99% 96%    Isolation Precautions No active isolations  Medications Medications  sodium chloride (PF) 0.9 % injection (has no administration in time range)  nicotine (NICODERM CQ - dosed in mg/24 hours) patch 21 mg (21 mg Transdermal Patch Applied 12/04/20 1239)   stroke: mapping our early stages of recovery book (has no administration in time range)  enoxaparin (LOVENOX) injection 40 mg (has no administration in time range)  acetaminophen (TYLENOL) tablet 650 mg (has no administration in time range)    Or  acetaminophen (TYLENOL) suppository 650 mg (has no administration in time range)  ondansetron (ZOFRAN) tablet 4 mg (has no administration in time range)    Or  ondansetron (ZOFRAN) injection 4 mg (has no administration in time range)  amoxicillin-clavulanate (AUGMENTIN) 875-125 MG per tablet 1 tablet (has no administration in time range)  nicotine (NICODERM CQ - dosed in mg/24 hours) patch 21 mg (has no administration in  time range)  lactated ringers infusion (has no administration in time range)  LORazepam (ATIVAN) injection 1 mg (1 mg Intravenous Given 12/04/20 1009)  iohexol (OMNIPAQUE) 350 MG/ML injection 100 mL (100 mLs Intravenous Contrast Given 12/04/20 0856)  amoxicillin-clavulanate (AUGMENTIN) 875-125 MG per tablet 1 tablet (1 tablet Oral Given 12/04/20 1009)  lactated ringers bolus 2,000 mL (0 mLs Intravenous Stopped 12/04/20 2236)  ketorolac (TORADOL) 15 MG/ML injection 15 mg (15 mg Intravenous Given 12/04/20 1628)    Mobility walks with person assist Low fall risk   Focused Assessments    R Recommendations: See Admitting Provider Note  Report given to:   Additional Notes:

## 2020-12-04 NOTE — ED Notes (Signed)
Dinner tray and ice pop provided.

## 2020-12-04 NOTE — ED Notes (Signed)
Call received from pt father Stanly Si [phone] requesting rtn call for pt status/updates. [employer]

## 2020-12-04 NOTE — ED Provider Notes (Signed)
Patient taken in sign out from [facility] [place]. Patient found in [place] room with resp distress rom fentanyl od. The patient awoke complaining of R hemiparesthesia and weaness. LKW 8:00 PM Patient is amnestic to most of the events last night and does not remember arriving here.    Physical Exam  BP 124/87   Pulse 93   Temp 98 F (36.7 C) (Oral)   Resp (!) 9   SpO2 93%   Physical Exam Patient is alert and oriented, no respiratory distress.  Answering questions appropriately, speech is fluent. Normal respiratory rate Lungs CTAB Regular rate and rhythm, no murmur Abdomen soft and nontender Neuro exam reveals weakness of the right upper and lower extremity.  Patient is able to extend his fingers but is extremely weak on that side.  Unable to lift the arm at the shoulder.  Patient is able to stand on the right leg but drags the foot.  He does not withdraw to noxious stimulus of the right foot.  Diminished reflexes throughout. Sensation abnormal to light touch and temperature on the right side. No obvious cranial nerve deficits or facial droop.  ED Course/Procedures   Clinical Course as of 12/04/20 1356  Thu Dec 04, 2020  1003 I have reviewed the patient's CT angio head and neck and cerebral perfusion study which is negative for any signs of infarct.  I have ordered MRI brain and cervical spine [AH]    Clinical Course User Index [AH] Arthor Captain, PA-C    Procedures  MDM   7:58 AM Patient case discussed with Dr. Amada Jupiter.   Concern for potential stroke versus anoxic brain injury versus vertebral artery dissection.  He asked that we get a CT T angiogram head and neck with perfusion, stat if negative patient will need MRI brain and C-spine.  Patient also complaining of pain in his sacrum.  Imaging ordered.  Labs ordered.  9:46 AM CTs have resulted and are negative for abnormality.  Incidental finding of groundglass opacity in the lung apex suspicious for pneumonia.  Given his recent  overdose will cover with Augmentin for aspiration.  MRI is ordered.   1:57 PM Patient's MRI brain and C-spine have resulted.  I have reviewed radiologic interpretation and images.  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  I discussed the findings with Dr. Amada Jupiter who states this is likely the cause of the patient's hemiparesis and hemiparesthesia.  He will need admission for further evaluation.  At bedside I discussed all findings with the patient and his mother.  He will need to be admitted.  Patient understands.  He is agreeable to the plan.  Case discussed with Dr. Sanda Klein who will admit the patient to hospitalist service.      Arthor Captain, PA-C 12/04/20 1400    Jacalyn Lefevre, MD 12/04/20 1414

## 2020-12-05 ENCOUNTER — Encounter (HOSPITAL_COMMUNITY): Payer: Self-pay | Admitting: Internal Medicine

## 2020-12-05 ENCOUNTER — Other Ambulatory Visit: Payer: Self-pay

## 2020-12-05 ENCOUNTER — Observation Stay (HOSPITAL_COMMUNITY): Payer: Self-pay

## 2020-12-05 DIAGNOSIS — R778 Other specified abnormalities of plasma proteins: Secondary | ICD-10-CM

## 2020-12-05 DIAGNOSIS — R531 Weakness: Secondary | ICD-10-CM

## 2020-12-05 DIAGNOSIS — M25512 Pain in left shoulder: Secondary | ICD-10-CM

## 2020-12-05 DIAGNOSIS — G8191 Hemiplegia, unspecified affecting right dominant side: Secondary | ICD-10-CM

## 2020-12-05 DIAGNOSIS — M6282 Rhabdomyolysis: Secondary | ICD-10-CM | POA: Diagnosis present

## 2020-12-05 DIAGNOSIS — G8911 Acute pain due to trauma: Secondary | ICD-10-CM

## 2020-12-05 DIAGNOSIS — R7989 Other specified abnormal findings of blood chemistry: Secondary | ICD-10-CM

## 2020-12-05 LAB — ECHOCARDIOGRAM COMPLETE
AR max vel: 3.33 cm2
AV Peak grad: 5.9 mmHg
Ao pk vel: 1.21 m/s
Area-P 1/2: 4.68 cm2
Calc EF: 54 %
S' Lateral: 3.5 cm
Single Plane A2C EF: 56 %
Single Plane A4C EF: 52.1 %

## 2020-12-05 LAB — LIPID PANEL
Cholesterol: 185 mg/dL (ref 0–200)
HDL: 55 mg/dL (ref >40)
LDL Cholesterol: 100 mg/dL — ABNORMAL HIGH (ref 0–99)
Total CHOL/HDL Ratio: 3.4 RATIO
Triglycerides: 149 mg/dL (ref <150)
VLDL: 30 mg/dL (ref 0–40)

## 2020-12-05 LAB — HEMOGLOBIN A1C
Hgb A1c MFr Bld: 5.2 % (ref 4.8–5.6)
Mean Plasma Glucose: 102.54 mg/dL

## 2020-12-05 LAB — CK: Total CK: 20341 U/L — ABNORMAL HIGH (ref 49–397)

## 2020-12-05 LAB — HEPATITIS PANEL, ACUTE
HCV Ab: NONREACTIVE
Hep A IgM: NONREACTIVE
Hep B C IgM: NONREACTIVE
Hepatitis B Surface Ag: NONREACTIVE

## 2020-12-05 LAB — HIV ANTIBODY (ROUTINE TESTING W REFLEX): HIV Screen 4th Generation wRfx: NONREACTIVE

## 2020-12-05 LAB — TROPONIN I (HIGH SENSITIVITY): Troponin I (High Sensitivity): 2082 ng/L (ref <18)

## 2020-12-05 MED ORDER — KETOROLAC TROMETHAMINE 15 MG/ML IJ SOLN
15.0000 mg | Freq: Once | INTRAMUSCULAR | Status: AC
  Administered 2020-12-05: 15 mg via INTRAVENOUS
  Filled 2020-12-05: qty 1

## 2020-12-05 NOTE — Progress Notes (Signed)
SLP Cancellation Note  Patient Details Name: JEFFERY BACHMEIER MRN: [id number] DOB: 14-Jan-1991   Cancelled treatment:       Reason Eval/Treat Not Completed: Other (comment). Pt working with OT. Will f/u.    Tameshia Bonneville, Riley Nearing 12/05/2020, 2:32 PM

## 2020-12-05 NOTE — Evaluation (Signed)
Physical Therapy Evaluation Patient Details Name: Jeffery Hudson MRN: [id number] DOB: 01/24/1990 Today's Date: 12/05/2020  History of Present Illness  Pt is 30 yo male admitted on 12/04/20 with unresonsiveness in setting of OD. Pt with R sided weakness - MRI showed some edema in basal ganglia; elevated troponin - no chest pain, cardiology examed and no further work up indicated, and AKI. Pt with hx of opioid and ETOH abuse.  Clinical Impression  Pt admitted with above diagnosis. At baseline pt is independent.  Today, pt did present with mild deficits in balance and safety awareness.  He had decreased strength in R hip abduction leading to trendlenberg gait at times and a loss of balance. Pt with painful R hip abduction and soreness in gluteal muscle - describes as very mild, "bruised" feeling.  Pt does have significant R shoulder pain and weakness that is his larger concern - addressed by OT.  Pt currently with functional limitations due to the deficits listed below (see PT Problem List). Pt will benefit from skilled PT to increase their independence and safety with mobility to allow discharge to the venue listed below.          Recommendations for follow up therapy are one component of a multi-disciplinary discharge planning process, led by the attending physician.  Recommendations may be updated based on patient status, additional functional criteria and insurance authorization.  Follow Up Recommendations Outpatient PT (pending progress may not need PT)    Assistance Recommended at Discharge None  Functional Status Assessment Patient has had a recent decline in their functional status and demonstrates the ability to make significant improvements in function in a reasonable and predictable amount of time.  Equipment Recommendations  None recommended by PT    Recommendations for Other Services       Precautions / Restrictions Precautions Precautions: None      Mobility  Bed  Mobility Overal bed mobility: Needs Assistance Bed Mobility: Supine to Sit;Sit to Supine     Supine to sit: Independent Sit to supine: Independent        Transfers Overall transfer level: Needs assistance Equipment used: None Transfers: Sit to/from Stand Sit to Stand: Modified independent (Device/Increase time)                Ambulation/Gait Ambulation/Gait assistance: Min assist Gait Distance (Feet): 400 Feet Assistive device: None Gait Pattern/deviations: Step-through pattern Gait velocity: normal     General Gait Details: Overall pt with close supervision but he did have 1 LOB to R side requiring min A to recover.  Pt distracted and was looking around talking when he lost balance to R.  Also, occasional trendelenberg gait on R side and slight decrease in stance time on R due to pain.  Stairs            Wheelchair Mobility    Modified Rankin (Stroke Patients Only)       Balance Overall balance assessment: Needs assistance   Sitting balance-Leahy Scale: Normal     Standing balance support: No upper extremity supported Standing balance-Leahy Scale: Good Standing balance comment: some deficits in single leg stance on R and with dynamic gait                             Pertinent Vitals/Pain Pain Assessment: 0-10 Pain Score: 2  Pain Location: R shoulder when still and has had meds Pain Descriptors / Indicators: Discomfort Pain Intervention(s): Limited activity within  patient's tolerance;Monitored during session;Premedicated before session                Pt also reports pain in R gluteal muscle and lateral thigh that increased with hip abd.  Home Living Family/patient expects to be discharged to:: Unsure (was at hotel with father)                   Additional Comments: Pt was living in a hotel with his father but unsure what at d/c - considering drug rehab    Prior Function Prior Level of Function : Independent/Modified  Independent                     Hand Dominance        Extremity/Trunk Assessment   Upper Extremity Assessment Upper Extremity Assessment: Defer to OT evaluation (R shoulder with main and limited movement - see OT note for further detail)    Lower Extremity Assessment Lower Extremity Assessment: LLE deficits/detail;RLE deficits/detail RLE Deficits / Details: ROM WFL but some deficits compared to R including increased stretch in H-S with SLR, increased pain with figure 4 piriformis stretch, pain with abd; MMT: ankle 5/5, knee 5/5, hip abd 4-/5 limited by pain, hip flexion 5/5 RLE Sensation: decreased light touch (decreased sensation R lateral and posteior thigh and buttock) LLE Deficits / Details: ROM WFL; MMT 5/5 LLE Sensation: WNL    Cervical / Trunk Assessment Cervical / Trunk Assessment: Normal  Communication   Communication: No difficulties  Cognition Arousal/Alertness: Awake/alert Behavior During Therapy: WFL for tasks assessed/performed Overall Cognitive Status: Within Functional Limits for tasks assessed                                          General Comments General comments (skin integrity, edema, etc.): Pt advised to focus with ambulation to prevent falls; also encouraged light stretching (hamstrings and piriformis) and AROM hip abduction as able; pt reports leg much better but is more focused on arm    Exercises     Assessment/Plan    PT Assessment Patient needs continued PT services  PT Problem List Decreased strength;Decreased mobility;Decreased safety awareness;Decreased range of motion;Decreased activity tolerance;Decreased balance;Decreased knowledge of use of DME;Impaired sensation;Pain       PT Treatment Interventions DME instruction;Therapeutic activities;Modalities;Gait training;Therapeutic exercise;Patient/family education;Stair training;Balance training;Functional mobility training;Manual techniques    PT Goals (Current  goals can be found in the Care Plan section)  Acute Rehab PT Goals Patient Stated Goal: Get shoudler working and decrease pain PT Goal Formulation: With patient/family Time For Goal Achievement: 12/19/20 Potential to Achieve Goals: Good Additional Goals Additional Goal #1: R hip abduction strength to 5/5 for improved gait quality    Frequency Min 3X/week   Barriers to discharge        Co-evaluation               AM-PAC PT "6 Clicks" Mobility  Outcome Measure Help needed turning from your back to your side while in a flat bed without using bedrails?: None Help needed moving from lying on your back to sitting on the side of a flat bed without using bedrails?: None Help needed moving to and from a bed to a chair (including a wheelchair)?: A Little Help needed standing up from a chair using your arms (e.g., wheelchair or bedside chair)?: A Little Help needed to walk in hospital  room?: A Little Help needed climbing 3-5 steps with a railing? : A Little 6 Click Score: 20    End of Session   Activity Tolerance: Patient tolerated treatment well Patient left: in bed;with call bell/phone within reach;with bed alarm set Nurse Communication: Mobility status PT Visit Diagnosis: Unsteadiness on feet (R26.81);Muscle weakness (generalized) (M62.81);Pain Pain - Right/Left: Right Pain - part of body: Hip    Time: 0263-7858 PT Time Calculation (min) (ACUTE ONLY): 20 min   Charges:   PT Evaluation $PT Eval Low Complexity: 1 Low          Kimyah Frein, PT Acute Rehab Services Pager [phone] [facility] [phone]   Rayetta Humphrey 12/05/2020, 2:03 PM

## 2020-12-05 NOTE — Progress Notes (Signed)
PROGRESS NOTE    Jeffery Hudson  [id number] DOB: [birth year] DOA: 12/04/2020 PCP: Patient, No Pcp Per (Inactive)    Brief Narrative:  30 y.o. male with medical history significant of opioid and alcohol abuse who was brought via EMS to the emergency department yesterday evening due to unresponsiveness in the setting of overdose.  The patient received 1.5 mg of Narcan IM and 0.5 mg of Narcan IV.  He also received at 10 mg albuterol plus ipratropium 0.5 mg neb and methylprednisolone 125 mg IVP.  When he woke up this morning, he noticed that he had right-sided weakness.  Weakness is more pronounced on the LUE.  No headache, nausea, vision changes, slurred speech, but his mother stated that he has been having some minor issues with language comprehension.  His symptoms have improved since he woke up this morning.  He denied sore throat, rhinorrhea, wheezing or hemoptysis.  No chest pain, palpitations, diaphoresis, PND, orthopnea or pitting edema of the lower extremities.  No abdominal pain, emesis, diarrhea, constipation, melena or hematochezia.  No flank pain, dysuria, frequency or hematuria.  No polyuria, polydipsia, polyphagia or blurred vision.  He did not want to talk about psychosocial issues.  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  Assessment & Plan:   Principal Problem:   Right sided weakness Active Problems:   AKI (acute kidney injury) (HCC)   Abnormal LFTs   Hypocalcemia   Polysubstance abuse (HCC)   Alcohol abuse   Elevated troponin  Principal Problem:   Right sided weakness Neurology was consulted Check fasting lipids and glucose level. MRI brain reviewed, small foci of cytotoxic edema in the basal ganglia B which is nonspecific but potentially secondary to toxin exposure/drug abuse 2d echo performed, pending results PT/OT/SLP consulted   Active Problems:   AKI (acute kidney injury) ([place]) secondary to rhabdo Given IVF  hydration overnight Avoid nephrotoxins. Avoid hypotension.   Presenting Cr 1.5, now 1.4 CK noted to be elevated at 20,341 Already on IVF hydration Repeat bmet and CK in AM     Abnormal LFTs Acute hepatitis panel neg Per above, pt does have evidence of rhabdo Continue IVF  Repeat LFT's in AM     Hypocalcemia Repeat lytes in AM     Polysubstance abuse ([place]) Have discussed with TOC. Pt states he is wanting to quit     Alcohol abuse No signs of alcohol withdrawal. Cont on MedSurg CIWA protocol if needed.  R shoulder pain Xray shoulder ordered, pending OT following    DVT prophylaxis: Lovenox subq Code Status: Full Family Communication: Pt in room, family at bedside  Status is: Observation  The patient will require care spanning > 2 midnights and should be moved to inpatient because: severity of illness, ARF with rhabdo needing IVF      Consultants:  Neurology Cardiology  Procedures:    Antimicrobials: Anti-infectives (From admission, onward)    Start     Dose/Rate Route Frequency Ordered Stop   12/04/20 2200  amoxicillin-clavulanate (AUGMENTIN) 875-125 MG per tablet 1 tablet        1 tablet Oral Every 12 hours 12/04/20 1604     12/04/20 1000  amoxicillin-clavulanate (AUGMENTIN) 875-125 MG per tablet 1 tablet        1 tablet Oral  Once 12/04/20 0951 12/04/20 1009       Subjective: Complaining of R shoulder pain, requesting analgesia  Objective: Vitals:   12/05/20 0600  12/05/20 0700 12/05/20 1005 12/05/20 1218  BP: (!) 128/92 125/88 (!) 154/96 (!) 154/99  Pulse: 74 72 78 67  Resp: 14 14 16 14   Temp:    98.4 F (36.9 C)  TempSrc:    Oral  SpO2: 96% 97% 98% 100%    Intake/Output Summary (Last 24 hours) at 12/05/2020 1501 Last data filed at 12/04/2020 2236 Gross per 24 hour  Intake 2000 ml  Output --  Net 2000 ml   There were no vitals filed for this visit.  Examination: General exam: Awake, laying in bed, in nad Respiratory system: Normal  respiratory effort, no wheezing Cardiovascular system: regular rate, s1, s2 Gastrointestinal system: Soft, nondistended, positive BS Central nervous system: CN2-12 grossly intact, strength intact Extremities: Perfused, no clubbing Skin: Normal skin turgor, no notable skin lesions seen Psychiatry: Mood normal // no visual hallucinations   Data Reviewed: I have personally reviewed following labs and imaging studies  CBC: Recent Labs  Lab 12/04/20 0826 12/04/20 0853  WBC  --  23.8*  NEUTROABS  --  21.7*  HGB 15.3 14.4  HCT 45.0 42.7  MCV  --  93.4  PLT  --  [id number]   Basic Metabolic Panel: Recent Labs  Lab 12/04/20 0826 12/04/20 0853  NA 136 134*  K 4.7 4.5  CL 104 103  CO2  --  21*  GLUCOSE 128* 139*  BUN 30* 27*  CREATININE 1.50* 1.41*  CALCIUM  --  8.8*   GFR: CrCl cannot be calculated (Unknown ideal weight.). Liver Function Tests: Recent Labs  Lab 12/04/20 0853  AST 301*  ALT 87*  ALKPHOS 42  BILITOT 0.6  PROT 7.1  ALBUMIN 4.5   No results for input(s): LIPASE, AMYLASE in the last 168 hours. No results for input(s): AMMONIA in the last 168 hours. Coagulation Profile: Recent Labs  Lab 12/04/20 0853  INR 1.0   Cardiac Enzymes: Recent Labs  Lab 12/05/20 1115  CKTOTAL 20,341*   BNP (last 3 results) No results for input(s): PROBNP in the last 8760 hours. HbA1C: Recent Labs    12/05/20 0405  HGBA1C 5.2   CBG: No results for input(s): GLUCAP in the last 168 hours. Lipid Profile: Recent Labs    12/05/20 0406  CHOL 185  HDL 55  LDLCALC 100*  TRIG 149  CHOLHDL 3.4   Thyroid Function Tests: No results for input(s): TSH, T4TOTAL, FREET4, T3FREE, THYROIDAB in the last 72 hours. Anemia Panel: No results for input(s): VITAMINB12, FOLATE, FERRITIN, TIBC, IRON, RETICCTPCT in the last 72 hours. Sepsis Labs: No results for input(s): PROCALCITON, LATICACIDVEN in the last 168 hours.  Recent Results (from the past 240 hour(s))  Resp Panel by RT-PCR  (Flu A&B, Covid) Nasopharyngeal Swab     Status: None   Collection Time: 12/04/20 12:42 PM   Specimen: Nasopharyngeal Swab; Nasopharyngeal(NP) swabs in vial transport medium  Result Value Ref Range Status   SARS Coronavirus 2 by RT PCR NEGATIVE NEGATIVE Final    Comment: (NOTE) SARS-CoV-2 target nucleic acids are NOT DETECTED.  The SARS-CoV-2 RNA is generally detectable in upper respiratory specimens during the acute phase of infection. The lowest concentration of SARS-CoV-2 viral copies this assay can detect is 138 copies/mL. A negative result does not preclude SARS-Cov-2 infection and should not be used as the sole basis for treatment or other patient management decisions. A negative result may occur with  improper specimen collection/handling, submission of specimen other than nasopharyngeal swab, presence of viral mutation(s) within the areas  targeted by this assay, and inadequate number of viral copies(<138 copies/mL). A negative result must be combined with clinical observations, patient history, and epidemiological information. The expected result is Negative.  Fact Sheet for Patients:  EntrepreneurPulse.com.au  Fact Sheet for Healthcare Providers:  IncredibleEmployment.be  This test is no t yet approved or cleared by the [place] FDA and  has been authorized for detection and/or diagnosis of SARS-CoV-2 by FDA under an Emergency Use Authorization (EUA). This EUA will remain  in effect (meaning this test can be used) for the duration of the COVID-19 declaration under Section 564(b)(1) of the Act, 21 U.S.C.section 360bbb-3(b)(1), unless the authorization is terminated  or revoked sooner.       Influenza A by PCR NEGATIVE NEGATIVE Final   Influenza B by PCR NEGATIVE NEGATIVE Final    Comment: (NOTE) The Xpert Xpress SARS-CoV-2/FLU/RSV plus assay is intended as an aid in the diagnosis of influenza from Nasopharyngeal swab specimens  and should not be used as a sole basis for treatment. Nasal washings and aspirates are unacceptable for Xpert Xpress SARS-CoV-2/FLU/RSV testing.  Fact Sheet for Patients: EntrepreneurPulse.com.au  Fact Sheet for Healthcare Providers: IncredibleEmployment.be  This test is not yet approved or cleared by the [place] FDA and has been authorized for detection and/or diagnosis of SARS-CoV-2 by FDA under an Emergency Use Authorization (EUA). This EUA will remain in effect (meaning this test can be used) for the duration of the COVID-19 declaration under Section 564(b)(1) of the Act, 21 U.S.C. section 360bbb-3(b)(1), unless the authorization is terminated or revoked.  Performed at [facility], [place] [address] [place], [place] [address]      Radiology Studies: CT ANGIO HEAD NECK W WO CM  Result Date: 12/04/2020 CLINICAL DATA:  Neuro deficit, acute, stroke suspected; right-sided weakness EXAM: CT ANGIOGRAPHY HEAD AND NECK CT PERFUSION BRAIN TECHNIQUE: Multidetector CT imaging of the head and neck was performed using the standard protocol during bolus administration of intravenous contrast. Multiplanar CT image reconstructions and MIPs were obtained to evaluate the vascular anatomy. Carotid stenosis measurements (when applicable) are obtained utilizing NASCET criteria, using the distal internal carotid diameter as the denominator. Multiphase CT imaging of the brain was performed following IV bolus contrast injection. Subsequent parametric perfusion maps were calculated using RAPID software. CONTRAST:  15mL OMNIPAQUE IOHEXOL 350 MG/ML SOLN COMPARISON:  None. FINDINGS: CTA NECK Aortic arch: Included thoracic aorta is unremarkable. Great vessel origins are patent. Right carotid system: Patent. No stenosis or evidence of dissection. Left carotid system: Patent.  No stenosis or evidence of dissection. Vertebral arteries: Patent. Left vertebral  is dominant. No stenosis or evidence of dissection. Skeleton: No acute abnormality. Degenerative changes at the left temporomandibular joint. Other neck: Asymmetric left supraclavicular and cervical lymph nodes are probably reactive. Upper chest: Patchy ground-glass density in the included upper lobes, left greater than right. Review of the MIP images confirms the above findings CTA HEAD Anterior circulation: Intracranial internal carotid arteries are patent. Anterior and middle cerebral arteries are patent. Posterior circulation: Intracranial vertebral arteries, basilar artery, and posterior cerebral arteries are patent. Bilateral posterior communicating arteries are present. Venous sinuses: Patent as allowed by contrast bolus timing. Review of the MIP images confirms the above findings CT Brain Perfusion Findings: CBF (<30%) Volume: 51mL Perfusion (Tmax>6.0s) volume: 55mL Mismatch Volume: 58mL Infarction Location: None. IMPRESSION: No large vessel occlusion, hemodynamically significant stenosis, or evidence of dissection. Perfusion imaging demonstrates no evidence of core infarction or penumbra. Patchy ground-glass density in the  included upper lobes suspicious for pneumonia. Asymmetric nonenlarged left supraclavicular and cervical lymph nodes are probably reactive. Electronically Signed   By: Macy Mis M.D.   On: 12/04/2020 09:29   DG Eye Foreign Body  Result Date: 12/04/2020 CLINICAL DATA:  Metal working/exposure; clearance prior to MRI EXAM: ORBITS FOR FOREIGN BODY - 2 VIEW COMPARISON:  It CT 12/04/2020 FINDINGS: There is no evidence of metallic foreign body within the orbits. No significant bone abnormality identified. IMPRESSION: No evidence of metallic foreign body within the orbits. Electronically Signed   By: Marijo Sanes M.D.   On: 12/04/2020 11:16   DG Sacrum/Coccyx  Result Date: 12/04/2020 CLINICAL DATA:  Syncope, tailbone pain EXAM: SACRUM AND COCCYX - 2+ VIEW COMPARISON:  None. FINDINGS:  Contrast in the bladder from a prior CT obscures the sacrum and coccyx on the AP projection. Within this confines, there is no evidence of acute fracture in the sacrum or coccyx. There is no other osseous abnormality. The SI joints and symphysis pubis are intact. The soft tissues are unremarkable. IMPRESSION: Contrast in the bladder obscures the sacrum and coccyx on the AP projection. Within this confines, no evidence of acute injury. Electronically Signed   By: Valetta Mole M.D.   On: 12/04/2020 09:28   CT Head Wo Contrast  Result Date: 12/04/2020 CLINICAL DATA:  30 year old male unresponsive, lethargy and right side weakness after fentanyl use. EXAM: CT HEAD WITHOUT CONTRAST TECHNIQUE: Contiguous axial images were obtained from the base of the skull through the vertex without intravenous contrast. COMPARISON:  Head CT 08/23/2010. FINDINGS: Brain: Normal cerebral volume. No midline shift, ventriculomegaly, mass effect, evidence of mass lesion, intracranial hemorrhage or evidence of cortically based acute infarction. Gray-white matter differentiation is within normal limits throughout the brain. Vascular: No suspicious intracranial vascular hyperdensity. Skull: Negative. Sinuses/Orbits: Trace fluid level left maxillary sinus. Other visualized paranasal sinuses and mastoids are clear. Other: Visualized orbits and scalp soft tissues are within normal limits. IMPRESSION: 1. Normal noncontrast CT appearance of the brain. 2. Trace left maxillary sinus fluid level. Electronically Signed   By: Genevie Ann M.D.   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 Right-sided weakness and numbness. Fentanyl use. EXAM: MRI HEAD WITHOUT CONTRAST MRI CERVICAL SPINE WITHOUT CONTRAST TECHNIQUE: Multiplanar, multiecho pulse sequences of the brain and surrounding structures, and cervical spine, to include the craniocervical  junction and cervicothoracic junction, were obtained without intravenous contrast. COMPARISON:  Head CT, head and neck CTA, and CT head perfusion 12/04/2020. FINDINGS: MRI HEAD FINDINGS The study is mildly motion degraded. Brain: There are subcentimeter regions of restricted diffusion and T2 hyperintensity bilaterally in the globi pallidi, and there is also a punctate focus of restricted diffusion at the posterior aspect of the right putamen. No definite restricted diffusion is evident in the cerebral cortex or cerebellum. There are a few scattered punctate foci of T2 hyperintensity in the cerebral white matter bilaterally suggesting nonspecific gliosis. The ventricles and sulci are normal. No intracranial hemorrhage, mass, midline shift, or extra-axial fluid collection is identified. Vascular: Major intracranial vascular flow voids are preserved. Skull and upper cervical spine: Unremarkable bone marrow signal para Sinuses/Orbits: Unremarkable orbits. Small mucous retention cyst in the left maxillary sinus. Clear mastoid air cells. Other: None. MRI CERVICAL SPINE FINDINGS The study is mildly motion degraded. Alignment: Normal. Vertebrae: No fracture, suspicious marrow lesion, or significant marrow edema. Cord: Normal signal and  morphology. Posterior Fossa, vertebral arteries, paraspinal tissues: Unremarkable. Disc levels: C2-3: Mild right uncovertebral spurring without stenosis. C3-4: Tiny central disc protrusion and minimal right uncovertebral spurring without stenosis. C4-5: Minimal disc bulging and left uncovertebral spurring result in borderline left neural foraminal stenosis without spinal stenosis. C5-6: Tiny central disc protrusion and minimal uncovertebral spurring without stenosis. C6-7: Minimal disc bulging and minimal uncovertebral spurring without stenosis. C7-T1: Negative. IMPRESSION: 1. Small foci of cytotoxic edema in the basal ganglia bilaterally, nonspecific but potentially secondary to toxin  exposure/drug abuse with this history. Other considerations include metabolic disturbances, acute lacunar infarcts, vasculitis, infection, and hypoxic-ischemic injury. 2. Minimal cervical spondylosis without evidence of significant stenosis. Normal appearance of the cervical spinal cord. Electronically Signed   By: Sebastian AcheAllen  Grady M.D.   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 Right-sided weakness and numbness. Fentanyl use. EXAM: MRI HEAD WITHOUT CONTRAST MRI CERVICAL SPINE WITHOUT CONTRAST TECHNIQUE: Multiplanar, multiecho pulse sequences of the brain and surrounding structures, and cervical spine, to include the craniocervical junction and cervicothoracic junction, were obtained without intravenous contrast. COMPARISON:  Head CT, head and neck CTA, and CT head perfusion 12/04/2020. FINDINGS: MRI HEAD FINDINGS The study is mildly motion degraded. Brain: There are subcentimeter regions of restricted diffusion and T2 hyperintensity bilaterally in the globi pallidi, and there is also a punctate focus of restricted diffusion at the posterior aspect of the right putamen. No definite restricted diffusion is evident in the cerebral cortex or cerebellum. There are a few scattered punctate foci of T2 hyperintensity in the cerebral white matter bilaterally suggesting nonspecific gliosis. The ventricles and sulci are normal. No intracranial hemorrhage, mass, midline shift, or extra-axial fluid collection is identified. Vascular: Major intracranial vascular flow voids are preserved. Skull and upper cervical spine: Unremarkable bone marrow signal para Sinuses/Orbits: Unremarkable orbits. Small mucous retention cyst in the left maxillary sinus. Clear mastoid air cells. Other: None. MRI CERVICAL SPINE FINDINGS The study is mildly motion degraded. Alignment: Normal. Vertebrae: No fracture, suspicious marrow  lesion, or significant marrow edema. Cord: Normal signal and morphology. Posterior Fossa, vertebral arteries, paraspinal tissues: Unremarkable. Disc levels: C2-3: Mild right uncovertebral spurring without stenosis. C3-4: Tiny central disc protrusion and minimal right uncovertebral spurring without stenosis. C4-5: Minimal disc bulging and left uncovertebral spurring result in borderline left neural foraminal stenosis without spinal stenosis. C5-6: Tiny central disc protrusion and minimal uncovertebral spurring without stenosis. C6-7: Minimal disc bulging and minimal uncovertebral spurring without stenosis. C7-T1: Negative. IMPRESSION: 1. Small foci of cytotoxic edema in the basal ganglia bilaterally, nonspecific but potentially secondary to toxin exposure/drug abuse with this history. Other considerations include metabolic disturbances, acute lacunar infarcts, vasculitis, infection, and hypoxic-ischemic injury. 2. Minimal cervical spondylosis without evidence of significant stenosis. Normal appearance of the cervical spinal cord. Electronically Signed   By: Sebastian AcheAllen  Grady M.D.   On: 12/04/2020 13:03   CT CEREBRAL PERFUSION W CONTRAST  Result Date: 12/04/2020 CLINICAL DATA:  Neuro deficit, acute, stroke suspected; right-sided weakness EXAM: CT ANGIOGRAPHY HEAD AND NECK CT PERFUSION BRAIN TECHNIQUE: Multidetector CT imaging of the head and neck was performed using the standard protocol during bolus administration of intravenous contrast. Multiplanar CT image reconstructions and MIPs were obtained to evaluate the vascular anatomy. Carotid stenosis measurements (when applicable) are obtained utilizing NASCET criteria, using the distal internal carotid diameter as the denominator. Multiphase CT imaging of the brain was performed following IV  bolus contrast injection. Subsequent parametric perfusion maps were calculated using RAPID software. CONTRAST:  146mL OMNIPAQUE IOHEXOL 350 MG/ML SOLN COMPARISON:  None. FINDINGS:  CTA NECK Aortic arch: Included thoracic aorta is unremarkable. Great vessel origins are patent. Right carotid system: Patent. No stenosis or evidence of dissection. Left carotid system: Patent.  No stenosis or evidence of dissection. Vertebral arteries: Patent. Left vertebral is dominant. No stenosis or evidence of dissection. Skeleton: No acute abnormality. Degenerative changes at the left temporomandibular joint. Other neck: Asymmetric left supraclavicular and cervical lymph nodes are probably reactive. Upper chest: Patchy ground-glass density in the included upper lobes, left greater than right. Review of the MIP images confirms the above findings CTA HEAD Anterior circulation: Intracranial internal carotid arteries are patent. Anterior and middle cerebral arteries are patent. Posterior circulation: Intracranial vertebral arteries, basilar artery, and posterior cerebral arteries are patent. Bilateral posterior communicating arteries are present. Venous sinuses: Patent as allowed by contrast bolus timing. Review of the MIP images confirms the above findings CT Brain Perfusion Findings: CBF (<30%) Volume: 41mL Perfusion (Tmax>6.0s) volume: 26mL Mismatch Volume: 29mL Infarction Location: None. IMPRESSION: No large vessel occlusion, hemodynamically significant stenosis, or evidence of dissection. Perfusion imaging demonstrates no evidence of core infarction or penumbra. Patchy ground-glass density in the included upper lobes suspicious for pneumonia. Asymmetric nonenlarged left supraclavicular and cervical lymph nodes are probably reactive. Electronically Signed   By: Macy Mis M.D.   On: 12/04/2020 09:29   DG CHEST PORT 1 VIEW  Result Date: 12/04/2020 CLINICAL DATA:  Aspiration into airway EXAM: PORTABLE CHEST 1 VIEW COMPARISON:  None. FINDINGS: The heart size and mediastinal contours are within normal limits. Both lungs are clear. The visualized skeletal structures are unremarkable. IMPRESSION: Negative.  Electronically Signed   By: Rolm Baptise M.D.   On: 12/04/2020 17:42   ECHOCARDIOGRAM COMPLETE  Result Date: 12/05/2020    ECHOCARDIOGRAM REPORT   Patient Name:   DAIQUAN SENERCHIA Date of Exam: 12/05/2020 Medical Rec #:  [id number]     Height:       68.0 in Accession #:    [id number]    Weight:       170.0 lb Date of Birth:  02-Mar-1990     BSA:          1.907 m Patient Age:    30 years      BP:           128/92 mmHg Patient Gender: M             HR:           70 bpm. Exam Location:  Inpatient Procedure: 2D Echo, Color Doppler and Cardiac Doppler Indications:    Stroke  History:        Patient has no prior history of Echocardiogram examinations.  Sonographer:    Jyl Heinz Referring Phys: [id number] DAVID MANUEL Holley  1. Left ventricular ejection fraction, by estimation, is 50 to 55%. The left ventricle has low normal function. The left ventricle has no regional wall motion abnormalities. There is mild asymmetric left ventricular hypertrophy of the basal-septal segment. Left ventricular diastolic parameters were normal.  2. Right ventricular systolic function is normal. The right ventricular size is normal.  3. Left atrial size was mildly dilated.  4. The mitral valve is normal in structure. Trivial mitral valve regurgitation. No evidence of mitral stenosis.  5. The aortic valve is tricuspid. Aortic valve regurgitation is not visualized. No aortic stenosis is present.  6. The inferior vena cava is dilated in size with <50% respiratory variability, suggesting right atrial pressure of 15 mmHg. FINDINGS  Left Ventricle: Left ventricular ejection fraction, by estimation, is 50 to 55%. The left ventricle has low normal function. The left ventricle has no regional wall motion abnormalities. The left ventricular internal cavity size was normal in size. There is mild asymmetric left ventricular hypertrophy of the basal-septal segment. Left ventricular diastolic parameters were normal. Normal left ventricular  filling pressure. Right Ventricle: The right ventricular size is normal. No increase in right ventricular wall thickness. Right ventricular systolic function is normal. Left Atrium: Left atrial size was mildly dilated. Right Atrium: Right atrial size was normal in size. Pericardium: There is no evidence of pericardial effusion. Mitral Valve: The mitral valve is normal in structure. Trivial mitral valve regurgitation. No evidence of mitral valve stenosis. Tricuspid Valve: The tricuspid valve is normal in structure. Tricuspid valve regurgitation is not demonstrated. No evidence of tricuspid stenosis. Aortic Valve: The aortic valve is tricuspid. Aortic valve regurgitation is not visualized. No aortic stenosis is present. Aortic valve peak gradient measures 5.9 mmHg. Pulmonic Valve: The pulmonic valve was normal in structure. Pulmonic valve regurgitation is trivial. No evidence of pulmonic stenosis. Aorta: The aortic root is normal in size and structure. Venous: The inferior vena cava is dilated in size with less than 50% respiratory variability, suggesting right atrial pressure of 15 mmHg. IAS/Shunts: No atrial level shunt detected by color flow Doppler.  LEFT VENTRICLE PLAX 2D LVIDd:         4.80 cm      Diastology LVIDs:         3.50 cm      LV e' medial:    9.79 cm/s LV PW:         0.90 cm      LV E/e' medial:  8.4 LV IVS:        1.10 cm      LV e' lateral:   12.30 cm/s LVOT diam:     2.20 cm      LV E/e' lateral: 6.7 LV SV:         74 LV SV Index:   39 LVOT Area:     3.80 cm  LV Volumes (MOD) LV vol d, MOD A2C: 146.0 ml LV vol d, MOD A4C: 128.0 ml LV vol s, MOD A2C: 64.3 ml LV vol s, MOD A4C: 61.3 ml LV SV MOD A2C:     81.7 ml LV SV MOD A4C:     128.0 ml LV SV MOD BP:      73.6 ml RIGHT VENTRICLE             IVC RV Basal diam:  3.30 cm     IVC diam: 2.40 cm RV Mid diam:    2.70 cm RV S prime:     10.90 cm/s TAPSE (M-mode): 2.7 cm LEFT ATRIUM             Index        RIGHT ATRIUM           Index LA diam:         3.40 cm 1.78 cm/m   RA Area:     13.30 cm LA Vol (A2C):   58.9 ml 30.88 ml/m  RA Volume:   32.40 ml  16.99 ml/m LA Vol (A4C):   47.7 ml 25.01 ml/m LA Biplane Vol: 53.8 ml 28.21 ml/m  AORTIC VALVE AV Area (Vmax):  3.33 cm AV Vmax:        121.00 cm/s AV Peak Grad:   5.9 mmHg LVOT Vmax:      106.00 cm/s LVOT Vmean:     79.600 cm/s LVOT VTI:       0.195 m  AORTA Ao Root diam: 2.80 cm Ao Asc diam:  2.80 cm MITRAL VALVE MV Area (PHT): 4.68 cm    SHUNTS MV Decel Time: 162 msec    Systemic VTI:  0.20 m MV E velocity: 81.80 cm/s  Systemic Diam: 2.20 cm MV A velocity: 65.10 cm/s MV E/A ratio:  1.26 Chilton Si MD Electronically signed by Chilton Si MD Signature Date/Time: 12/05/2020/2:24:33 PM    Final     Scheduled Meds:   stroke: mapping our early stages of recovery book   Does not apply Once   amoxicillin-clavulanate  1 tablet Oral Q12H   enoxaparin (LOVENOX) injection  40 mg Subcutaneous Q24H   nicotine  21 mg Transdermal Daily   Continuous Infusions:  lactated ringers 125 mL/hr at 12/05/20 1151     LOS: 0 days   Rickey Barbara, MD [facility] Pager On Amion  If 7PM-7AM, please contact night-coverage 12/05/2020, 3:01 PM

## 2020-12-05 NOTE — Plan of Care (Signed)
  Problem: Education: Goal: Knowledge of General Education information will improve Description: Including pain rating scale, medication(s)/side effects and non-pharmacologic comfort measures Outcome: Progressing   Problem: Safety: Goal: Ability to remain free from injury will improve Outcome: Progressing   Problem: Pain Managment: Goal: General experience of comfort will improve Outcome: Not Progressing

## 2020-12-05 NOTE — Evaluation (Signed)
Occupational Therapy Evaluation Patient Details Name: Jeffery Hudson MRN: [id number] DOB: 02-Dec-1990 Today's Date: 12/05/2020   History of Present Illness Pt is 30 yo male admitted on 12/04/20 with unresonsiveness in setting of OD. Pt with R sided weakness - MRI showed some edema in basal ganglia; elevated troponin - no chest pain, cardiology examed and no further work up indicated, and AKI. Pt with hx of opioid and ETOH abuse.   Clinical Impression   Mr. Jeffery Hudson is a 30 year old man who presents with above pertinent medical history. On evaluation he demonstrates decreased ROM, strength, and coordination of right dominant upper extremity. He also reports impaired sensation throughout arm and the feeling of pins and needles. Patient also reports shoulder pain and discomfort. On assessment patient's shoulder deficits greater than elbow, wrist and hand with significant derangements in ROM and strength of shoulder flexion,abduction, internal rotation and external rotation. He has bruising on the anterior shoulder and some swelling. Patient does have some weakness in rest of arm with mild gross coordination deficits and compensatory movement noted. Suspect some form of shoulder injury imposed on mild neurological deficits. From an ADL and functional mobility standpoint patient demonstrates ability to maneuver and compensate as needed with one arm. Patient will benefit from skilled OT services while in hospital to improve deficits and learn compensatory strategies as needed in order to return to PLOF. Recommend OP OT at discharge.        Recommendations for follow up therapy are one component of a multi-disciplinary discharge planning process, led by the attending physician.  Recommendations may be updated based on patient status, additional functional criteria and insurance authorization.   Follow Up Recommendations  Outpatient OT    Assistance Recommended at Discharge Intermittent  Supervision/Assistance  Functional Status Assessment  Patient has had a recent decline in their functional status and demonstrates the ability to make significant improvements in function in a reasonable and predictable amount of time.  Equipment Recommendations  None recommended by OT    Recommendations for Other Services       Precautions / Restrictions Precautions Precautions: None Restrictions Weight Bearing Restrictions: No      Mobility Bed Mobility Overal bed mobility: Independent Bed Mobility: Supine to Sit;Sit to Supine     Supine to sit: Independent Sit to supine: Independent        Transfers Overall transfer level: Modified independent Equipment used: None Transfers: Sit to/from Stand Sit to Stand: Modified independent (Device/Increase time)           General transfer comment: He is able to ambulate with modified independence  - though he is at risk for falls.      Balance Overall balance assessment: Needs assistance   Sitting balance-Leahy Scale: Normal     Standing balance support: No upper extremity supported Standing balance-Leahy Scale: Good Standing balance comment: some deficits in single leg stance on R and with dynamic gait                           ADL either performed or assessed with clinical judgement   ADL Overall ADL's : Modified independent                                       General ADL Comments: Patient cane perform all ADLs with compensatory strategies - whether altering his position or  using his left hand only.     Vision Patient Visual Report: No change from baseline       Perception     Praxis      Pertinent Vitals/Pain Pain Assessment: Faces Pain Score: 2  Faces Pain Scale: Hurts little more Pain Location: R shoulder Pain Descriptors / Indicators: Discomfort;Grimacing Pain Intervention(s): Monitored during session;Premedicated before session;Limited activity within patient's  tolerance     Hand Dominance Right   Extremity/Trunk Assessment Upper Extremity Assessment Upper Extremity Assessment: RUE deficits/detail;LUE deficits/detail RUE Deficits / Details: Shoulder flexion 2-/5, shoulder internal rotation 2-/5, external rotation 2-/5 bicep 4/5, tricep 4-/5, wrist 4/5, grip 4/5; bruise on anterior shoulder, mild swelling around glenohumeral joint RUE Coordination: decreased gross motor (fine motor coordination is grossly intact - exhibits some compensatory wrist flexion) LUE Deficits / Details: WNL ROM, 5/5 strength LUE Sensation: WNL LUE Coordination: WNL   Lower Extremity Assessment Lower Extremity Assessment: LLE deficits/detail;RLE deficits/detail RLE Deficits / Details: ROM WFL but some deficits compared to R including increased stretch in H-S with SLR, increased pain with figure 4 piriformis stretch, pain with abd; MMT: ankle 5/5, knee 5/5, hip abd 4-/5 limited by pain, hip flexion 5/5 RLE Sensation: decreased light touch (decreased sensation R lateral and posteior thigh and buttock) LLE Deficits / Details: ROM WFL; MMT 5/5 LLE Sensation: WNL   Cervical / Trunk Assessment Cervical / Trunk Assessment: Normal   Communication Communication Communication: No difficulties   Cognition Arousal/Alertness: Awake/alert Behavior During Therapy: WFL for tasks assessed/performed Overall Cognitive Status: Within Functional Limits for tasks assessed                                       General Comments  Pt advised to focus with ambulation to prevent falls; also encouraged light stretching (hamstrings and piriformis) and AROM hip abduction as able; pt reports leg much better but is more focused on arm    Exercises Other Exercises Other Exercises: Educated on how to perform ROM reps in supine and with active assist of other hand.   Shoulder Instructions      Home Living Family/patient expects to be discharged to:: Unsure                                  Additional Comments: Pt was living in a hotel with his father but unsure what at d/c - considering drug rehab      Prior Functioning/Environment Prior Level of Function : Independent/Modified Independent             Mobility Comments: independent. 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 ADLs Comments: independent.        OT Problem List: Decreased strength;Decreased range of motion;Impaired UE functional use;Pain      OT Treatment/Interventions: Therapeutic exercise;Patient/family education;Therapeutic activities    OT Goals(Current goals can be found in the care plan section) Acute Rehab OT Goals Patient Stated Goal: to improve arm strength OT Goal Formulation: With patient Time For Goal Achievement: 12/19/20 Potential to Achieve Goals: Good  OT Frequency: Min 2X/week   Barriers to D/C:            Co-evaluation              AM-PAC OT "6 Clicks" Daily Activity     Outcome Measure Help from another person eating meals?: None  Help from another person taking care of personal grooming?: None Help from another person toileting, which includes using toliet, bedpan, or urinal?: None Help from another person bathing (including washing, rinsing, drying)?: None Help from another person to put on and taking off regular upper body clothing?: None Help from another person to put on and taking off regular lower body clothing?: None 6 Click Score: 24   End of Session Nurse Communication:  (need for ice pack, potential shoulder injury.)  Activity Tolerance: Patient tolerated treatment well Patient left: in bed;with call bell/phone within reach;with bed alarm set;with family/visitor present  OT Visit Diagnosis: Muscle weakness (generalized) (M62.81);Pain;Hemiplegia and hemiparesis Hemiplegia - Right/Left: Right Hemiplegia - dominant/non-dominant: Dominant Hemiplegia - caused by: Other cerebrovascular disease Pain - part of body: Shoulder                 Time: 2876-8115 OT Time Calculation (min): 19 min Charges:  OT General Charges $OT Visit: 1 Visit OT Evaluation $OT Eval Moderate Complexity: 1 Mod  Deandria Klute, OTR/L Acute Care Rehab Services  Office [phone] Pager: [phone]   Kelli Churn 12/05/2020, 3:13 PM

## 2020-12-05 NOTE — Consult Note (Signed)
Cardiology Consultation:   Patient ID: Jeffery Hudson MRN: [id number]; DOB: 26-Sep-1990  Admit date: 12/04/2020 Date of Consult: 12/05/2020  PCP:  Patient, No Pcp Per (Inactive)   [facility] Providers Cardiologist:  None        Patient Profile:   Jeffery Hudson is a 30 y.o. male with a hx of polysubs abuse, ADHD, bipolar d/o, asthma, migraines, who is being seen 12/05/2020 for the evaluation of elevated troponin at the request of Dr Wyline Copas.  History of Present Illness:   Jeffery Hudson was brought to the ER 08/31 for help with meth/heroin withdrawal. He was to go to [facility] from the ER.   He was brought to the ER 11/16 for overdose on fentanyl by EMS. His condition improved with Narcan and time. He then c/o R arm numbness and R sided weakness. CXR concerning for asp PNA >> augmentin. CT/MRI brain ordered, pt has cytotoxic injury to the cerebellum >> pt admitted. BUN/Cr 27/1.41, WBC 23.8, Trop 2416 >> 2082, Cards asked to see. No CK done. Has gotten hydration.  Mr. Jeffery Hudson works at [employer].  He says his job is very strenuous.  He does not get chest pain with exertion.  He gets dyspnea with higher levels of exertion, but it resolves quickly with rest.  Even when doing drugs, he has not really had any chest pain.  He is currently not short of breath, and resting comfortably.  He smokes a pack a day, and uses THC regularly.  He has been clean at periods during his life, but basically has a history of off/on drug abuse for the last 16 years.  He was told that he is hypertensive today, but he has no history of hypertension, hyperlipidemia, or diabetes.  There is no family history of premature coronary artery disease.   Past Medical History:  Diagnosis Date   ADHD    Bipolar disorder ([place])    Childhood asthma    Drug abuse ([place])    Migraines     History reviewed. No pertinent surgical history.   Home Medications:  Prior to Admission medications   Medication Sig Start  Date End Date Taking? Authorizing Provider  HYDROcodone-acetaminophen (HYCET) 7.5-325 mg/15 ml solution Take 10 mLs by mouth every 4 (four) hours as needed for pain. Patient not taking: Reported on 12/04/2020 10/21/12   Clayton Bibles, PA-C  ibuprofen (ADVIL,MOTRIN) 800 MG tablet Take 1 tablet (800 mg total) by mouth 3 (three) times daily. Patient not taking: Reported on 12/04/2020 10/21/12   Clayton Bibles, PA-C    Inpatient Medications: Scheduled Meds:   stroke: mapping our early stages of recovery book   Does not apply Once   amoxicillin-clavulanate  1 tablet Oral Q12H   enoxaparin (LOVENOX) injection  40 mg Subcutaneous Q24H   nicotine  21 mg Transdermal Once   nicotine  21 mg Transdermal Daily   Continuous Infusions:  lactated ringers 125 mL/hr at 12/04/20 2355   PRN Meds: acetaminophen **OR** acetaminophen, ondansetron **OR** ondansetron (ZOFRAN) IV  Allergies:   No Known Allergies  Social History:   Social History   Socioeconomic History   Marital status: Single    Spouse name: Not on file   Number of children: Not on file   Years of education: Not on file   Highest education level: Not on file  Occupational History   Not on file  Tobacco Use   Smoking status: Every Day    Packs/day: 1.00    Years: 10.00  Pack years: 10.00    Types: Cigarettes   Smokeless tobacco: Never  Substance and Sexual Activity   Alcohol use: Yes    Alcohol/week: 28.0 standard drinks    Types: 28 Cans of beer per week    Comment: 3-4 beers per night   Drug use: Yes    Types: Cocaine    Comment: benzos, heroin   Sexual activity: Yes    Birth control/protection: Condom  Other Topics Concern   Not on file  Social History Narrative   Not on file   Social Determinants of Health   Financial Resource Strain: Not on file  Food Insecurity: Not on file  Transportation Needs: Not on file  Physical Activity: Not on file  Stress: Not on file  Social Connections: Not on file  Intimate Partner  Violence: Not on file    Family History:   Family History  Problem Relation Age of Onset   Bipolar disorder Mother    Atrial fibrillation Mother    Bipolar disorder Father    Congestive Heart Failure Maternal Grandmother    Heart disease Maternal Aunt    Heart disease Maternal Aunt    Ovarian cancer Maternal Aunt    Atrial fibrillation Maternal Aunt      ROS:  Please see the history of present illness.  All other ROS reviewed and negative.     Physical Exam/Data:   Vitals:   12/05/20 0500 12/05/20 0600 12/05/20 0700 12/05/20 1005  BP: (!) 134/97 (!) 128/92 125/88 (!) 154/96  Pulse: 74 74 72 78  Resp: 14 14 14 16   Temp:      TempSrc:      SpO2: 97% 96% 97% 98%    Intake/Output Summary (Last 24 hours) at 12/05/2020 1104 Last data filed at 12/04/2020 2236 Gross per 24 hour  Intake 2000 ml  Output --  Net 2000 ml   Last 3 Weights 09/17/2020 08/02/2011  Weight (lbs) 170 lb 165 lb  Weight (kg) 77.111 kg 74.844 kg     There is no height or weight on file to calculate BMI.  General:  Well nourished, well developed, in no acute distress HEENT: normal Neck: JVD 9 cm Vascular: No carotid bruits; Distal pulses 2+ bilaterally Cardiac:  normal S1, S2; RRR; no murmur  Lungs:  Rales R>L, no wheezing, rhonchi    Abd: soft, nontender, no hepatomegaly  Ext: no edema Musculoskeletal:  No deformities, BUE and BLE strength normal and equal Skin: warm and dry  Neuro:  CNs 2-12 intact, no focal abnormalities noted Psych:  Normal affect   EKG:  The EKG was personally reviewed and demonstrates:  SR, HR 80, Inferolateral Q waves Telemetry:  Telemetry was personally reviewed and demonstrates:  SR  Relevant CV Studies:  ECHO: Performed, results pending  Laboratory Data:  High Sensitivity Troponin:   Recent Labs  Lab 12/04/20 2114 12/04/20 2355  TROPONINIHS 2,416* 2,082*     Chemistry Recent Labs  Lab 12/04/20 0826 12/04/20 0853  NA 136 134*  K 4.7 4.5  CL 104 103  CO2   --  21*  GLUCOSE 128* 139*  BUN 30* 27*  CREATININE 1.50* 1.41*  CALCIUM  --  8.8*  GFRNONAA  --  >60  ANIONGAP  --  10    Recent Labs  Lab 12/04/20 0853  PROT 7.1  ALBUMIN 4.5  AST 301*  ALT 87*  ALKPHOS 42  BILITOT 0.6   Lipids  Recent Labs  Lab 12/05/20 0406  CHOL  185  TRIG 149  HDL 55  LDLCALC 100*  CHOLHDL 3.4    Hematology Recent Labs  Lab 12/04/20 0826 12/04/20 0853  WBC  --  23.8*  RBC  --  4.57  HGB 15.3 14.4  HCT 45.0 42.7  MCV  --  93.4  MCH  --  31.5  MCHC  --  33.7  RDW  --  13.9  PLT  --  284   Thyroid No results for input(s): TSH, FREET4 in the last 168 hours.  BNPNo results for input(s): BNP, PROBNP in the last 168 hours.  DDimer No results for input(s): DDIMER in the last 168 hours. Lab Results  Component Value Date   CHOL 185 12/05/2020   HDL 55 12/05/2020   LDLCALC 100 (H) 12/05/2020   TRIG 149 12/05/2020   CHOLHDL 3.4 12/05/2020   Lab Results  Component Value Date   HGBA1C 5.2 12/05/2020   Drugs of Abuse     Component Value Date/Time   LABOPIA NONE DETECTED 12/04/2020 0803   COCAINSCRNUR POSITIVE (A) 12/04/2020 0803   LABBENZ NONE DETECTED 12/04/2020 0803   AMPHETMU NONE DETECTED 12/04/2020 0803   THCU POSITIVE (A) 12/04/2020 0803   LABBARB NONE DETECTED 12/04/2020 0803      Radiology/Studies:  CT ANGIO HEAD NECK W WO CM  Result Date: 12/04/2020 CLINICAL DATA:  Neuro deficit, acute, stroke suspected; right-sided weakness EXAM: CT ANGIOGRAPHY HEAD AND NECK CT PERFUSION BRAIN TECHNIQUE: Multidetector CT imaging of the head and neck was performed using the standard protocol during bolus administration of intravenous contrast. Multiplanar CT image reconstructions and MIPs were obtained to evaluate the vascular anatomy. Carotid stenosis measurements (when applicable) are obtained utilizing NASCET criteria, using the distal internal carotid diameter as the denominator. Multiphase CT imaging of the brain was performed following  IV bolus contrast injection. Subsequent parametric perfusion maps were calculated using RAPID software. CONTRAST:  OMNIPAQUE IOHEXOL 350 MG/ML SOLN COMPARISON:  None. FINDINGS: CTA NECK Aortic arch: Included thoracic aorta is unremarkable. Great vessel origins are patent. Right carotid system: Patent. No stenosis or evidence of dissection. Left carotid system: Patent.  No stenosis or evidence of dissection. Vertebral arteries: Patent. Left vertebral is dominant. No stenosis or evidence of dissection. Skeleton: No acute abnormality. Degenerative changes at the left temporomandibular joint. Other neck: Asymmetric left supraclavicular and cervical lymph nodes are probably reactive. Upper chest: Patchy ground-glass density in the included upper lobes, left greater than right. Review of the MIP images confirms the above findings CTA HEAD Anterior circulation: Intracranial internal carotid arteries are patent. Anterior and middle cerebral arteries are patent. Posterior circulation: Intracranial vertebral arteries, basilar artery, and posterior cerebral arteries are patent. Bilateral posterior communicating arteries are present. Venous sinuses: Patent as allowed by contrast bolus timing. Review of the MIP images confirms the above findings CT Brain Perfusion Findings: CBF (<30%) Volume: 6mL Perfusion (Tmax>6.0s) volume: 46mL Mismatch Volume: 65mL Infarction Location: None. IMPRESSION: No large vessel occlusion, hemodynamically significant stenosis, or evidence of dissection. Perfusion imaging demonstrates no evidence of core infarction or penumbra. Patchy ground-glass density in the included upper lobes suspicious for pneumonia. Asymmetric nonenlarged left supraclavicular and cervical lymph nodes are probably reactive. Electronically Signed   By: Guadlupe Spanish M.D.   On: 12/04/2020 09:29   DG Eye Foreign Body  Result Date: 12/04/2020 CLINICAL DATA:  Metal working/exposure; clearance prior to MRI EXAM: ORBITS FOR  FOREIGN BODY - 2 VIEW COMPARISON:  It CT 12/04/2020 FINDINGS: There is no evidence of  metallic foreign body within the orbits. No significant bone abnormality identified. IMPRESSION: No evidence of metallic foreign body within the orbits. Electronically Signed   By: Marijo Sanes M.D.   On: 12/04/2020 11:16   DG Sacrum/Coccyx  Result Date: 12/04/2020 CLINICAL DATA:  Syncope, tailbone pain EXAM: SACRUM AND COCCYX - 2+ VIEW COMPARISON:  None. FINDINGS: Contrast in the bladder from a prior CT obscures the sacrum and coccyx on the AP projection. Within this confines, there is no evidence of acute fracture in the sacrum or coccyx. There is no other osseous abnormality. The SI joints and symphysis pubis are intact. The soft tissues are unremarkable. IMPRESSION: Contrast in the bladder obscures the sacrum and coccyx on the AP projection. Within this confines, no evidence of acute injury. Electronically Signed   By: Valetta Mole M.D.   On: 12/04/2020 09:28   CT Head Wo Contrast  Result Date: 12/04/2020 CLINICAL DATA:  30 year old male unresponsive, lethargy and right side weakness after fentanyl use. EXAM: CT HEAD WITHOUT CONTRAST TECHNIQUE: Contiguous axial images were obtained from the base of the skull through the vertex without intravenous contrast. COMPARISON:  Head CT 08/23/2010. FINDINGS: Brain: Normal cerebral volume. No midline shift, ventriculomegaly, mass effect, evidence of mass lesion, intracranial hemorrhage or evidence of cortically based acute infarction. Gray-white matter differentiation is within normal limits throughout the brain. Vascular: No suspicious intracranial vascular hyperdensity. Skull: Negative. Sinuses/Orbits: Trace fluid level left maxillary sinus. Other visualized paranasal sinuses and mastoids are clear. Other: Visualized orbits and scalp soft tissues are within normal limits. IMPRESSION: 1. Normal noncontrast CT appearance of the brain. 2. Trace left maxillary sinus fluid level.  Electronically Signed   By: Genevie Ann M.D.   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 Right-sided weakness and numbness. Fentanyl use. EXAM: MRI HEAD WITHOUT CONTRAST MRI CERVICAL SPINE WITHOUT CONTRAST TECHNIQUE: Multiplanar, multiecho pulse sequences of the brain and surrounding structures, and cervical spine, to include the craniocervical junction and cervicothoracic junction, were obtained without intravenous contrast. COMPARISON:  Head CT, head and neck CTA, and CT head perfusion 12/04/2020. FINDINGS: MRI HEAD FINDINGS The study is mildly motion degraded. Brain: There are subcentimeter regions of restricted diffusion and T2 hyperintensity bilaterally in the globi pallidi, and there is also a punctate focus of restricted diffusion at the posterior aspect of the right putamen. No definite restricted diffusion is evident in the cerebral cortex or cerebellum. There are a few scattered punctate foci of T2 hyperintensity in the cerebral white matter bilaterally suggesting nonspecific gliosis. The ventricles and sulci are normal. No intracranial hemorrhage, mass, midline shift, or extra-axial fluid collection is identified. Vascular: Major intracranial vascular flow voids are preserved. Skull and upper cervical spine: Unremarkable bone marrow signal para Sinuses/Orbits: Unremarkable orbits. Small mucous retention cyst in the left maxillary sinus. Clear mastoid air cells. Other: None. MRI CERVICAL SPINE FINDINGS The study is mildly motion degraded. Alignment: Normal. Vertebrae: No fracture, suspicious marrow lesion, or significant marrow edema. Cord: Normal signal and morphology. Posterior Fossa, vertebral arteries, paraspinal tissues: Unremarkable. Disc levels: C2-3: Mild right uncovertebral spurring without stenosis. C3-4: Tiny central disc protrusion and minimal right uncovertebral spurring without stenosis.  C4-5: Minimal disc bulging and left uncovertebral spurring result in borderline left neural foraminal stenosis without spinal stenosis. C5-6: Tiny central disc protrusion and minimal uncovertebral spurring without stenosis. C6-7: Minimal disc bulging and minimal uncovertebral spurring without stenosis. C7-T1:  Negative. IMPRESSION: 1. Small foci of cytotoxic edema in the basal ganglia bilaterally, nonspecific but potentially secondary to toxin exposure/drug abuse with this history. Other considerations include metabolic disturbances, acute lacunar infarcts, vasculitis, infection, and hypoxic-ischemic injury. 2. Minimal cervical spondylosis without evidence of significant stenosis. Normal appearance of the cervical spinal cord. Electronically Signed   By: Logan Bores M.D.   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 Right-sided weakness and numbness. Fentanyl use. EXAM: MRI HEAD WITHOUT CONTRAST MRI CERVICAL SPINE WITHOUT CONTRAST TECHNIQUE: Multiplanar, multiecho pulse sequences of the brain and surrounding structures, and cervical spine, to include the craniocervical junction and cervicothoracic junction, were obtained without intravenous contrast. COMPARISON:  Head CT, head and neck CTA, and CT head perfusion 12/04/2020. FINDINGS: MRI HEAD FINDINGS The study is mildly motion degraded. Brain: There are subcentimeter regions of restricted diffusion and T2 hyperintensity bilaterally in the globi pallidi, and there is also a punctate focus of restricted diffusion at the posterior aspect of the right putamen. No definite restricted diffusion is evident in the cerebral cortex or cerebellum. There are a few scattered punctate foci of T2 hyperintensity in the cerebral white matter bilaterally suggesting nonspecific gliosis. The ventricles and sulci are normal. No intracranial hemorrhage, mass, midline  shift, or extra-axial fluid collection is identified. Vascular: Major intracranial vascular flow voids are preserved. Skull and upper cervical spine: Unremarkable bone marrow signal para Sinuses/Orbits: Unremarkable orbits. Small mucous retention cyst in the left maxillary sinus. Clear mastoid air cells. Other: None. MRI CERVICAL SPINE FINDINGS The study is mildly motion degraded. Alignment: Normal. Vertebrae: No fracture, suspicious marrow lesion, or significant marrow edema. Cord: Normal signal and morphology. Posterior Fossa, vertebral arteries, paraspinal tissues: Unremarkable. Disc levels: C2-3: Mild right uncovertebral spurring without stenosis. C3-4: Tiny central disc protrusion and minimal right uncovertebral spurring without stenosis. C4-5: Minimal disc bulging and left uncovertebral spurring result in borderline left neural foraminal stenosis without spinal stenosis. C5-6: Tiny central disc protrusion and minimal uncovertebral spurring without stenosis. C6-7: Minimal disc bulging and minimal uncovertebral spurring without stenosis. C7-T1: Negative. IMPRESSION: 1. Small foci of cytotoxic edema in the basal ganglia bilaterally, nonspecific but potentially secondary to toxin exposure/drug abuse with this history. Other considerations include metabolic disturbances, acute lacunar infarcts, vasculitis, infection, and hypoxic-ischemic injury. 2. Minimal cervical spondylosis without evidence of significant stenosis. Normal appearance of the cervical spinal cord. Electronically Signed   By: Logan Bores M.D.   On: 12/04/2020 13:03   CT CEREBRAL PERFUSION W CONTRAST  Result Date: 12/04/2020 CLINICAL DATA:  Neuro deficit, acute, stroke suspected; right-sided weakness EXAM: CT ANGIOGRAPHY HEAD AND NECK CT PERFUSION BRAIN TECHNIQUE: Multidetector CT imaging of the head and neck was performed using the standard protocol during bolus administration of intravenous contrast. Multiplanar CT image reconstructions and  MIPs were obtained to evaluate the vascular anatomy. Carotid stenosis measurements (when applicable) are obtained utilizing NASCET criteria, using the distal internal carotid diameter as the denominator. Multiphase CT imaging of the brain was performed following IV bolus contrast injection. Subsequent parametric perfusion maps were calculated using RAPID software. CONTRAST:  186mL OMNIPAQUE IOHEXOL 350 MG/ML SOLN COMPARISON:  None. FINDINGS: CTA NECK Aortic arch: Included thoracic aorta is unremarkable. Great vessel origins are patent. Right carotid system: Patent. No stenosis or evidence of dissection. Left carotid system: Patent.  No stenosis or evidence of dissection. Vertebral arteries: Patent. Left vertebral is dominant. No stenosis or  evidence of dissection. Skeleton: No acute abnormality. Degenerative changes at the left temporomandibular joint. Other neck: Asymmetric left supraclavicular and cervical lymph nodes are probably reactive. Upper chest: Patchy ground-glass density in the included upper lobes, left greater than right. Review of the MIP images confirms the above findings CTA HEAD Anterior circulation: Intracranial internal carotid arteries are patent. Anterior and middle cerebral arteries are patent. Posterior circulation: Intracranial vertebral arteries, basilar artery, and posterior cerebral arteries are patent. Bilateral posterior communicating arteries are present. Venous sinuses: Patent as allowed by contrast bolus timing. Review of the MIP images confirms the above findings CT Brain Perfusion Findings: CBF (<30%) Volume: 46mL Perfusion (Tmax>6.0s) volume: 73mL Mismatch Volume: 60mL Infarction Location: None. IMPRESSION: No large vessel occlusion, hemodynamically significant stenosis, or evidence of dissection. Perfusion imaging demonstrates no evidence of core infarction or penumbra. Patchy ground-glass density in the included upper lobes suspicious for pneumonia. Asymmetric nonenlarged left  supraclavicular and cervical lymph nodes are probably reactive. Electronically Signed   By: Macy Mis M.D.   On: 12/04/2020 09:29   DG CHEST PORT 1 VIEW  Result Date: 12/04/2020 CLINICAL DATA:  Aspiration into airway EXAM: PORTABLE CHEST 1 VIEW COMPARISON:  None. FINDINGS: The heart size and mediastinal contours are within normal limits. Both lungs are clear. The visualized skeletal structures are unremarkable. IMPRESSION: Negative. Electronically Signed   By: Rolm Baptise M.D.   On: 12/04/2020 17:42     Assessment and Plan:   Elevated troponin -In the setting of drug overdose - No CK was performed, 1 is ordered. - Echo was performed, results pending - No history of chest pain, despite a strenuous job - Once echo and CK results are reviewed, MD advise on further evaluation.  2.  Overdose - He says he was taking fentanyl, but the UDS was positive for cocaine and THC. - At any rate his mental status improved with Narcan and he is recovering from this - He has struggled staying off drugs throughout his adult life. - It is hoped he can find a way   Risk Assessment/Risk Scores:       For questions or updates, please contact [place] Please consult [email or web address] for contact info under    Signed, Rosaria Ferries, PA-C  12/05/2020 11:04 AM

## 2020-12-05 NOTE — Plan of Care (Signed)
  Problem: Education: Goal: Knowledge of General Education information will improve Description: Including pain rating scale, medication(s)/side effects and non-pharmacologic comfort measures Outcome: Progressing   Problem: Education: Goal: Knowledge of secondary prevention will improve (SELECT ALL) Outcome: Progressing   

## 2020-12-06 LAB — COMPREHENSIVE METABOLIC PANEL
ALT: 131 U/L — ABNORMAL HIGH (ref 0–44)
AST: 274 U/L — ABNORMAL HIGH (ref 15–41)
Albumin: 3.6 g/dL (ref 3.5–5.0)
Alkaline Phosphatase: 37 U/L — ABNORMAL LOW (ref 38–126)
Anion gap: 7 (ref 5–15)
BUN: 14 mg/dL (ref 6–20)
CO2: 26 mmol/L (ref 22–32)
Calcium: 8.8 mg/dL — ABNORMAL LOW (ref 8.9–10.3)
Chloride: 105 mmol/L (ref 98–111)
Creatinine, Ser: 0.88 mg/dL (ref 0.61–1.24)
GFR, Estimated: >60 mL/min (ref >60)
Glucose, Bld: 102 mg/dL — ABNORMAL HIGH (ref 70–99)
Potassium: 3.8 mmol/L (ref 3.5–5.1)
Sodium: 138 mmol/L (ref 135–145)
Total Bilirubin: 0.7 mg/dL (ref 0.3–1.2)
Total Protein: 6.1 g/dL — ABNORMAL LOW (ref 6.5–8.1)

## 2020-12-06 LAB — CK: Total CK: 11566 U/L — ABNORMAL HIGH (ref 49–397)

## 2020-12-06 MED ORDER — POTASSIUM CHLORIDE 10 MEQ/100ML IV SOLN
10.0000 meq | INTRAVENOUS | Status: AC
  Administered 2020-12-06 (×4): 10 meq via INTRAVENOUS
  Filled 2020-12-06 (×4): qty 100

## 2020-12-06 NOTE — Progress Notes (Signed)
PROGRESS NOTE    Jeffery STOCKSTILL  [id number] DOB: 07-Feb-1990 DOA: 12/04/2020 PCP: Patient, No Pcp Per (Inactive)    Brief Narrative:  30 y.o. male with medical history significant of opioid and alcohol abuse who was brought via EMS to the emergency department yesterday evening due to unresponsiveness in the setting of overdose.  The patient received 1.5 mg of Narcan IM and 0.5 mg of Narcan IV.  He also received at 10 mg albuterol plus ipratropium 0.5 mg neb and methylprednisolone 125 mg IVP.  When he woke up this morning, he noticed that he had right-sided weakness.  Weakness is more pronounced on the LUE.  No headache, nausea, vision changes, slurred speech, but his mother stated that he has been having some minor issues with language comprehension.  His symptoms have improved since he woke up this morning.  He denied sore throat, rhinorrhea, wheezing or hemoptysis.  No chest pain, palpitations, diaphoresis, PND, orthopnea or pitting edema of the lower extremities.  No abdominal pain, emesis, diarrhea, constipation, melena or hematochezia.  No flank pain, dysuria, frequency or hematuria.  No polyuria, polydipsia, polyphagia or blurred vision.  He did not want to talk about psychosocial issues.  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  Assessment & Plan:   Principal Problem:   Right sided weakness Active Problems:   AKI (acute kidney injury) (HCC)   Abnormal LFTs   Hypocalcemia   Polysubstance abuse (HCC)   Alcohol abuse   Elevated troponin   Rhabdomyolysis  Principal Problem:   Right sided weakness Neurology was consulted Check fasting lipids and glucose level.  Fasting glucose fasting lipids and A1c unremarkable MRI brain reviewed, small foci of cytotoxic edema in the basal ganglia B which is nonspecific but potentially secondary to toxin exposure/drug abuse 2d echo performed, reviewed, findings of EF of 50 to 55% otherwise  unremarkable PT/OT/SLP recommendations for outpatient PT and OT   Active Problems:   AKI (acute kidney injury) ([place]) secondary to rhabdo ontinue to void nephrotoxins. Avoid hypotension.   Presenting Cr 1.5, now normalized CK initially noted to be elevated at 20,341, today down to 11,000 Continue IV fluid hydration Repeat bmet and CK in AM     Abnormal LFTs Acute hepatitis panel neg Per above, pt does have evidence of rhabdo Continue IVF  LFTs improving Repeat LFT's in AM     Hypocalcemia Repeat lytes in AM     Polysubstance abuse ([place]) Have discussed with TOC. Pt states he is wanting to quit     Alcohol abuse No signs of alcohol withdrawal. Cont on MedSurg CIWA protocol if needed.  R shoulder pain Xray shoulder ordered, no evidence of fracture or dislocation Continue with analgesia as needed    DVT prophylaxis: Lovenox subq Code Status: Full Family Communication: Pt in room, family at bedside  Status is: Observation  The patient will require care spanning > 2 midnights and should be moved to inpatient because: severity of illness, ARF with rhabdo needing IVF      Consultants:  Neurology Cardiology  Procedures:    Antimicrobials: Anti-infectives (From admission, onward)    Start     Dose/Rate Route Frequency Ordered Stop   12/04/20 2200  amoxicillin-clavulanate (AUGMENTIN) 875-125 MG per tablet 1 tablet        1 tablet Oral Every 12 hours 12/04/20 1604     12/04/20 1000  amoxicillin-clavulanate (AUGMENTIN) 875-125 MG per tablet 1 tablet  1 tablet Oral  Once 12/04/20 0951 12/04/20 1009       Subjective: Without complaints this morning.  Denies shortness of breath or chest pain  Objective: Vitals:   12/05/20 2017 12/05/20 2355 12/06/20 0541 12/06/20 1048  BP: (!) 167/112 (!) 142/100 (!) 159/113 (!) 145/104  Pulse: 65 66 72 (!) 55  Resp: 20 16 20    Temp: 98.1 F (36.7 C) 98.5 F (36.9 C) 97.9 F (36.6 C)   TempSrc: Oral Oral Oral    SpO2: 100% 97% 98%   Weight:      Height:        Intake/Output Summary (Last 24 hours) at 12/06/2020 1237 Last data filed at 12/06/2020 0600 Gross per 24 hour  Intake 3990.08 ml  Output --  Net 3990.08 ml    Filed Weights   12/05/20 1844  Weight: 79.4 kg    Examination: General exam: Conversant, in no acute distress Respiratory system: normal chest rise, clear, no audible wheezing Cardiovascular system: regular rhythm, s1-s2 Gastrointestinal system: Nondistended, nontender, pos BS Central nervous system: No seizures, no tremors Extremities: No cyanosis, no joint deformities Skin: No rashes, no pallor Psychiatry: Affect normal // no auditory hallucinations    Data Reviewed: I have personally reviewed following labs and imaging studies  CBC: Recent Labs  Lab 12/04/20 0826 12/04/20 0853  WBC  --  23.8*  NEUTROABS  --  21.7*  HGB 15.3 14.4  HCT 45.0 42.7  MCV  --  93.4  PLT  --  284    Basic Metabolic Panel: Recent Labs  Lab 12/04/20 0826 12/04/20 0853 12/06/20 0356  NA 136 134* 138  K 4.7 4.5 3.8  CL 104 103 105  CO2  --  21* 26  GLUCOSE 128* 139* 102*  BUN 30* 27* 14  CREATININE 1.50* 1.41* 0.88  CALCIUM  --  8.8* 8.8*    GFR: Estimated Creatinine Clearance: 117.7 mL/min (by C-G formula based on SCr of 0.88 mg/dL). Liver Function Tests: Recent Labs  Lab 12/04/20 0853 12/06/20 0356  AST 301* 274*  ALT 87* 131*  ALKPHOS 42 37*  BILITOT 0.6 0.7  PROT 7.1 6.1*  ALBUMIN 4.5 3.6    No results for input(s): LIPASE, AMYLASE in the last 168 hours. No results for input(s): AMMONIA in the last 168 hours. Coagulation Profile: Recent Labs  Lab 12/04/20 0853  INR 1.0    Cardiac Enzymes: Recent Labs  Lab 12/05/20 1115 12/06/20 0356  CKTOTAL 20,341* 11,566*    BNP (last 3 results) No results for input(s): PROBNP in the last 8760 hours. HbA1C: Recent Labs    12/05/20 0405  HGBA1C 5.2    CBG: No results for input(s): GLUCAP in the  last 168 hours. Lipid Profile: Recent Labs    12/05/20 0406  CHOL 185  HDL 55  LDLCALC 100*  TRIG 149  CHOLHDL 3.4    Thyroid Function Tests: No results for input(s): TSH, T4TOTAL, FREET4, T3FREE, THYROIDAB in the last 72 hours. Anemia Panel: No results for input(s): VITAMINB12, FOLATE, FERRITIN, TIBC, IRON, RETICCTPCT in the last 72 hours. Sepsis Labs: No results for input(s): PROCALCITON, LATICACIDVEN in the last 168 hours.  Recent Results (from the past 240 hour(s))  Resp Panel by RT-PCR (Flu A&B, Covid) Nasopharyngeal Swab     Status: [facility]   Collection Time: 12/04/20 12:42 PM   Specimen: Nasopharyngeal Swab; Nasopharyngeal(NP) swabs in vial transport medium  Result Value Ref Range Status   SARS Coronavirus 2 by RT PCR NEGATIVE  NEGATIVE Final    Comment: (NOTE) SARS-CoV-2 target nucleic acids are NOT DETECTED.  The SARS-CoV-2 RNA is generally detectable in upper respiratory specimens during the acute phase of infection. The lowest concentration of SARS-CoV-2 viral copies this assay can detect is 138 copies/mL. A negative result does not preclude SARS-Cov-2 infection and should not be used as the sole basis for treatment or other patient management decisions. A negative result may occur with  improper specimen collection/handling, submission of specimen other than nasopharyngeal swab, presence of viral mutation(s) within the areas targeted by this assay, and inadequate number of viral copies(<138 copies/mL). A negative result must be combined with clinical observations, patient history, and epidemiological information. The expected result is Negative.  Fact Sheet for Patients:  BloggerCourse.com  Fact Sheet for Healthcare Providers:  SeriousBroker.it  This test is no t yet approved or cleared by the [place] FDA and  has been authorized for detection and/or diagnosis of SARS-CoV-2 by FDA under an Emergency Use  Authorization (EUA). This EUA will remain  in effect (meaning this test can be used) for the duration of the COVID-19 declaration under Section 564(b)(1) of the Act, 21 U.S.C.section 360bbb-3(b)(1), unless the authorization is terminated  or revoked sooner.       Influenza A by PCR NEGATIVE NEGATIVE Final   Influenza B by PCR NEGATIVE NEGATIVE Final    Comment: (NOTE) The Xpert Xpress SARS-CoV-2/FLU/RSV plus assay is intended as an aid in the diagnosis of influenza from Nasopharyngeal swab specimens and should not be used as a sole basis for treatment. Nasal washings and aspirates are unacceptable for Xpert Xpress SARS-CoV-2/FLU/RSV testing.  Fact Sheet for Patients: BloggerCourse.com  Fact Sheet for Healthcare Providers: SeriousBroker.it  This test is not yet approved or cleared by the [place] FDA and has been authorized for detection and/or diagnosis of SARS-CoV-2 by FDA under an Emergency Use Authorization (EUA). This EUA will remain in effect (meaning this test can be used) for the duration of the COVID-19 declaration under Section 564(b)(1) of the Act, 21 U.S.C. section 360bbb-3(b)(1), unless the authorization is terminated or revoked.  Performed at [facility], [address] [place], [place] [address]       Radiology Studies: DG Shoulder Right  Result Date: 12/05/2020 CLINICAL DATA:  Right shoulder pain. EXAM: RIGHT SHOULDER - 2+ VIEW COMPARISON:  [facility]. FINDINGS: There is no evidence of fracture or dislocation. There is no evidence of arthropathy or other focal bone abnormality. Soft tissues are unremarkable. IMPRESSION: Negative. Electronically Signed   By: Tish Frederickson M.D.   On: 12/05/2020 15:05   DG CHEST PORT 1 VIEW  Result Date: 12/04/2020 CLINICAL DATA:  Aspiration into airway EXAM: PORTABLE CHEST 1 VIEW COMPARISON:  [facility]. FINDINGS: The heart size and mediastinal contours are  within normal limits. Both lungs are clear. The visualized skeletal structures are unremarkable. IMPRESSION: Negative. Electronically Signed   By: Charlett Nose M.D.   On: 12/04/2020 17:42   ECHOCARDIOGRAM COMPLETE  Result Date: 12/05/2020    ECHOCARDIOGRAM REPORT   Patient Name:   LEHI CORNS Date of Exam: 12/05/2020 Medical Rec #:  [id number]     Height:       68.0 in Accession #:    [id number]    Weight:       170.0 lb Date of Birth:  November 13, 1990     BSA:          1.907 m Patient Age:    30 years  BP:           128/92 mmHg Patient Gender: M             HR:           70 bpm. Exam Location:  Inpatient Procedure: 2D Echo, Color Doppler and Cardiac Doppler Indications:    Stroke  History:        Patient has no prior history of Echocardiogram examinations.  Sonographer:    Jyl Heinz Referring Phys: [id number] DAVID MANUEL [place]  1. Left ventricular ejection fraction, by estimation, is 50 to 55%. The left ventricle has low normal function. The left ventricle has no regional wall motion abnormalities. There is mild asymmetric left ventricular hypertrophy of the basal-septal segment. Left ventricular diastolic parameters were normal.  2. Right ventricular systolic function is normal. The right ventricular size is normal.  3. Left atrial size was mildly dilated.  4. The mitral valve is normal in structure. Trivial mitral valve regurgitation. No evidence of mitral stenosis.  5. The aortic valve is tricuspid. Aortic valve regurgitation is not visualized. No aortic stenosis is present.  6. The inferior vena cava is dilated in size with <50% respiratory variability, suggesting right atrial pressure of 15 mmHg. FINDINGS  Left Ventricle: Left ventricular ejection fraction, by estimation, is 50 to 55%. The left ventricle has low normal function. The left ventricle has no regional wall motion abnormalities. The left ventricular internal cavity size was normal in size. There is mild asymmetric left ventricular  hypertrophy of the basal-septal segment. Left ventricular diastolic parameters were normal. Normal left ventricular filling pressure. Right Ventricle: The right ventricular size is normal. No increase in right ventricular wall thickness. Right ventricular systolic function is normal. Left Atrium: Left atrial size was mildly dilated. Right Atrium: Right atrial size was normal in size. Pericardium: There is no evidence of pericardial effusion. Mitral Valve: The mitral valve is normal in structure. Trivial mitral valve regurgitation. No evidence of mitral valve stenosis. Tricuspid Valve: The tricuspid valve is normal in structure. Tricuspid valve regurgitation is not demonstrated. No evidence of tricuspid stenosis. Aortic Valve: The aortic valve is tricuspid. Aortic valve regurgitation is not visualized. No aortic stenosis is present. Aortic valve peak gradient measures 5.9 mmHg. Pulmonic Valve: The pulmonic valve was normal in structure. Pulmonic valve regurgitation is trivial. No evidence of pulmonic stenosis. Aorta: The aortic root is normal in size and structure. Venous: The inferior vena cava is dilated in size with less than 50% respiratory variability, suggesting right atrial pressure of 15 mmHg. IAS/Shunts: No atrial level shunt detected by color flow Doppler.  LEFT VENTRICLE PLAX 2D LVIDd:         4.80 cm      Diastology LVIDs:         3.50 cm      LV e' medial:    9.79 cm/s LV PW:         0.90 cm      LV E/e' medial:  8.4 LV IVS:        1.10 cm      LV e' lateral:   12.30 cm/s LVOT diam:     2.20 cm      LV E/e' lateral: 6.7 LV SV:         74 LV SV Index:   39 LVOT Area:     3.80 cm  LV Volumes (MOD) LV vol d, MOD A2C: 146.0 ml LV vol d, MOD A4C: 128.0 ml LV vol  s, MOD A2C: 64.3 ml LV vol s, MOD A4C: 61.3 ml LV SV MOD A2C:     81.7 ml LV SV MOD A4C:     128.0 ml LV SV MOD BP:      73.6 ml RIGHT VENTRICLE             IVC RV Basal diam:  3.30 cm     IVC diam: 2.40 cm RV Mid diam:    2.70 cm RV S prime:      10.90 cm/s TAPSE (M-mode): 2.7 cm LEFT ATRIUM             Index        RIGHT ATRIUM           Index LA diam:        3.40 cm 1.78 cm/m   RA Area:     13.30 cm LA Vol (A2C):   58.9 ml 30.88 ml/m  RA Volume:   32.40 ml  16.99 ml/m LA Vol (A4C):   47.7 ml 25.01 ml/m LA Biplane Vol: 53.8 ml 28.21 ml/m  AORTIC VALVE AV Area (Vmax): 3.33 cm AV Vmax:        121.00 cm/s AV Peak Grad:   5.9 mmHg LVOT Vmax:      106.00 cm/s LVOT Vmean:     79.600 cm/s LVOT VTI:       0.195 m  AORTA Ao Root diam: 2.80 cm Ao Asc diam:  2.80 cm MITRAL VALVE MV Area (PHT): 4.68 cm    SHUNTS MV Decel Time: 162 msec    Systemic VTI:  0.20 m MV E velocity: 81.80 cm/s  Systemic Diam: 2.20 cm MV A velocity: 65.10 cm/s MV E/A ratio:  1.26 Skeet Latch MD Electronically signed by Skeet Latch MD Signature Date/Time: 12/05/2020/2:24:33 PM    Final     Scheduled Meds:   stroke: mapping our early stages of recovery book   Does not apply Once   amoxicillin-clavulanate  1 tablet Oral Q12H   enoxaparin (LOVENOX) injection  40 mg Subcutaneous Q24H   nicotine  21 mg Transdermal Daily   Continuous Infusions:  lactated ringers 125 mL/hr at 12/06/20 1028     LOS: 1 day   Marylu Lund, MD [facility] Pager On Amion  If 7PM-7AM, please contact night-coverage 12/06/2020, 12:37 PM

## 2020-12-06 NOTE — TOC Progression Note (Signed)
Transition of Care [facility]) - Progression Note    Patient Details  Name: Jeffery [facility] MRN: [id number] Date of Birth: [birth year]  [facility]) CM/SW Contact  Geni Bers, RN Phone Number: 12/06/2020, 3:43 PM  Clinical Narrative:    Spoke with pt concerning substance abuse information. Placed Substance Abuse information in AVS.    Expected Discharge Plan: OP Rehab Barriers to Discharge: No Barriers Identified  Expected Discharge Plan and Services Expected Discharge Plan: OP Rehab       Living arrangements for the past 2 months: Single Family Home                                       Social Determinants of Health (SDOH) Interventions    Readmission Risk Interventions No flowsheet data found.

## 2020-12-06 NOTE — Progress Notes (Signed)
Occupational Therapy Treatment Patient Details Name: Jeffery [facility] MRN: [id number] DOB: 1990/11/30 Today's Date: 12/06/2020   History of present illness Pt is 30 yo male admitted on 12/04/20 with unresonsiveness in setting of OD. Pt with R sided weakness - MRI showed some edema in basal ganglia; elevated troponin - no chest pain, cardiology examed and no further work up indicated, and AKI. Pt with hx of opioid and ETOH abuse.   OT comments  Patient reports improved strength, ROM and sensation in Jeffery Hudson today compared to yesterday. Patient's shoulder flexion and abduction still impaired significantly but internal and external ROM and strength up to 4-/5. Reports improvement of his RLE to normal. Cont POC.   Recommendations for follow up therapy are one component of a multi-disciplinary discharge planning process, led by the attending physician.  Recommendations may be updated based on patient status, additional functional criteria and insurance authorization.    Follow Up Recommendations  Outpatient OT    Assistance Recommended at Discharge None  Equipment Recommendations  None recommended by OT    Recommendations for Other Services      Precautions / Restrictions Precautions Precautions: None Restrictions Weight Bearing Restrictions: No       Mobility Bed Mobility Overal bed mobility: Independent                  Transfers Overall transfer level: Independent                 General transfer comment: Reports independence with mobility. Reports RLE is normal now and he can stand on one leg.     Balance Overall balance assessment: Independent                                         ADL either performed or assessed with clinical judgement   ADL Overall ADL's : Independent                                            Extremity/Trunk Assessment Upper Extremity Assessment Upper Extremity Assessment: RUE deficits/detail RUE  Deficits / Details: Shoulder flexion 2+/5, shoulder internal rotation 4-/5, external rotation 4-/5 bicep 5/5, tricep 4-/5, wrist 5/5, grip 4+/5; bruise on anterior shoulder, mild swelling around glenohumeral joint. Noted some mild scapular winging today compared to left. RUE Sensation:  (reports pins and needles have gone, improved sensation, still a little decreased sensation on the back of his hand.)            Vision Patient Visual Report: No change from baseline     Perception     Praxis      Cognition Arousal/Alertness: Awake/alert Behavior During Therapy: WFL for tasks assessed/performed Overall Cognitive Status: Within Functional Limits for tasks assessed                                            Exercises Other Exercises Other Exercises: demonstrated how to perform internal and external ROM exercises with towel roll, scapula retraction, active assist shoulder flexion.   Shoulder Instructions       General Comments      Pertinent Vitals/ Pain       Pain Assessment: Faces  Faces Pain Scale: Hurts a little bit Pain Location: R shoulder - with AROM Pain Descriptors / Indicators: Discomfort;Grimacing Pain Intervention(s): Limited activity within patient's tolerance  Home Living                                          Prior Functioning/Environment              Frequency  Min 2X/week        Progress Toward Goals  OT Goals(current goals can now be found in the care plan section)  Progress towards OT goals: Progressing toward goals  Acute Rehab OT Goals Patient Stated Goal: to improve arm strength OT Goal Formulation: With patient Time For Goal Achievement: 12/19/20 Potential to Achieve Goals: Good  Plan Discharge plan remains appropriate    Co-evaluation                 AM-PAC OT "6 Clicks" Daily Activity     Outcome Measure   Help from another person eating meals?: None Help from another person  taking care of personal grooming?: None Help from another person toileting, which includes using toliet, bedpan, or urinal?: None Help from another person bathing (including washing, rinsing, drying)?: None Help from another person to put on and taking off regular upper body clothing?: None Help from another person to put on and taking off regular lower body clothing?: None 6 Click Score: 24    End of Session    OT Visit Diagnosis: Muscle weakness (generalized) (M62.81);Pain;Hemiplegia and hemiparesis Hemiplegia - Right/Left: Right Hemiplegia - dominant/non-dominant: Dominant Hemiplegia - caused by: Other cerebrovascular disease Pain - Right/Left: Right Pain - part of body: Shoulder   Activity Tolerance Patient tolerated treatment well   Patient Left in bed;with call bell/phone within reach;with family/visitor present   Nurse Communication  (Okay to see)        Time: 2637-8588 OT Time Calculation (min): 11 min  Charges: OT General Charges $OT Visit: 1 Visit OT Treatments $Therapeutic Exercise: 8-22 mins  Waldron Session, OTR/L Acute Care Rehab Services  Office [phone] Pager: [phone]   Kelli Churn 12/06/2020, 2:27 PM

## 2020-12-07 LAB — COMPREHENSIVE METABOLIC PANEL
ALT: 121 U/L — ABNORMAL HIGH (ref 0–44)
AST: 164 U/L — ABNORMAL HIGH (ref 15–41)
Albumin: 3.6 g/dL (ref 3.5–5.0)
Alkaline Phosphatase: 40 U/L (ref 38–126)
Anion gap: 10 (ref 5–15)
BUN: 11 mg/dL (ref 6–20)
CO2: 25 mmol/L (ref 22–32)
Calcium: 9.3 mg/dL (ref 8.9–10.3)
Chloride: 102 mmol/L (ref 98–111)
Creatinine, Ser: 0.95 mg/dL (ref 0.61–1.24)
GFR, Estimated: >60 mL/min (ref >60)
Glucose, Bld: 93 mg/dL (ref 70–99)
Potassium: 3.7 mmol/L (ref 3.5–5.1)
Sodium: 137 mmol/L (ref 135–145)
Total Bilirubin: 0.6 mg/dL (ref 0.3–1.2)
Total Protein: 6.6 g/dL (ref 6.5–8.1)

## 2020-12-07 LAB — CK: Total CK: 5575 U/L — ABNORMAL HIGH (ref 49–397)

## 2020-12-07 LAB — CBC
HCT: 43 % (ref 39.0–52.0)
Hemoglobin: 14.7 g/dL (ref 13.0–17.0)
MCH: 30.4 pg (ref 26.0–34.0)
MCHC: 34.2 g/dL (ref 30.0–36.0)
MCV: 89 fL (ref 80.0–100.0)
Platelets: 260 10*3/uL (ref 150–400)
RBC: 4.83 MIL/uL (ref 4.22–5.81)
RDW: 13.1 % (ref 11.5–15.5)
WBC: 8 10*3/uL (ref 4.0–10.5)
nRBC: 0 % (ref 0.0–0.2)

## 2020-12-07 NOTE — Plan of Care (Signed)
  Problem: Clinical Measurements: Goal: Diagnostic test results will improve Outcome: Progressing   Problem: Nutrition: Goal: Adequate nutrition will be maintained Outcome: Adequate for Discharge   Problem: Pain Managment: Goal: General experience of comfort will improve Outcome: Adequate for Discharge   Problem: Safety: Goal: Ability to remain free from injury will improve Outcome: Adequate for Discharge

## 2020-12-07 NOTE — Discharge Summary (Signed)
Physician Discharge Summary  Jeffery Hudson [id number] DOB: [birth year] DOA: 12/04/2020  PCP: Patient, No Pcp Per (Inactive)  Admit date: 12/04/2020 Discharge date: 12/07/2020  Admitted From: Home Disposition:  Home  Recommendations for Outpatient Follow-up:  Follow up with PCP in 1-2 weeks Pt advised to abstain from further illicit substances, resources given by [facility]  Discharge Condition:Improved CODE STATUS:Full Diet recommendation: Regular   Brief/Interim Summary: 30 y.o. male with medical history significant of opioid and alcohol abuse who was brought via EMS to the emergency department yesterday evening due to unresponsiveness in the setting of overdose.  The patient received 1.5 mg of Narcan IM and 0.5 mg of Narcan IV.  He also received at 10 mg albuterol plus ipratropium 0.5 mg neb and methylprednisolone 125 mg IVP.  When he woke up this morning, he noticed that he had right-sided weakness.  Weakness is more pronounced on the LUE.  No headache, nausea, vision changes, slurred speech, but his mother stated that he has been having some minor issues with language comprehension.  His symptoms have improved since he woke up this morning.  He denied sore throat, rhinorrhea, wheezing or hemoptysis.  No chest pain, palpitations, diaphoresis, PND, orthopnea or pitting edema of the lower extremities.  No abdominal pain, emesis, diarrhea, constipation, melena or hematochezia.  No flank pain, dysuria, frequency or hematuria.  No polyuria, polydipsia, polyphagia or blurred vision.  He did not want to talk about psychosocial issues.  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    Discharge Diagnoses:  Principal Problem:   Right sided weakness Active Problems:   AKI (acute kidney injury) (HCC)   Abnormal LFTs   Hypocalcemia   Polysubstance abuse (HCC)   Alcohol abuse   Elevated troponin   Rhabdomyolysis  Principal Problem:   Right sided  weakness Neurology was consulted. No further recs at this time. If persistent problems, then may consider outpatient EMG per Neurology Check fasting lipids and glucose level.  Fasting glucose fasting lipids and A1c unremarkable MRI brain reviewed, small foci of cytotoxic edema in the basal ganglia B which is nonspecific but potentially secondary to toxin exposure/drug abuse 2d echo performed, reviewed, findings of EF of 50 to 55% otherwise unremarkable PT/OT/SLP recommendations for outpatient PT and OT   Active Problems:   AKI (acute kidney injury) ([place]) secondary to rhabdo ontinue to void nephrotoxins. Avoid hypotension.   Presenting Cr 1.5, now normalized CK initially noted to be elevated at 20,341, today down to 5,000 at time of d/c. Pt self-hydrating well Improved wtih IV fluid hydration    Abnormal LFTs Acute hepatitis panel neg Per above, pt does have evidence of rhabdo LFTs improving     Hypocalcemia     Polysubstance abuse ([place]) Have discussed with TOC. Denies suicidal or homicidal ideations Pt states he is wanting to quit     Alcohol abuse No signs of alcohol withdrawal.   R shoulder pain Xray shoulder ordered, no evidence of fracture or dislocation Continue with analgesia as needed   Leukocytosis at time of presentation -Unclear etiology -Improved to normal by time of d/c  Elevated troponin -Likely secondary to rhabdo above -Seen by Cardiology -2d echo unremarkable, reviewed, with no WMA  Discharge Instructions   Allergies as of 12/07/2020   No Known Allergies      Medication List     STOP taking these medications    HYDROcodone-acetaminophen 7.5-325 mg/15 ml solution Commonly known  as: HYCET       TAKE these medications    ibuprofen 800 MG tablet Commonly known as: ADVIL Take 1 tablet (800 mg total) by mouth 3 (three) times daily.        Follow-up Information     Follow up with PCP in 1-2 weeks Follow up.   Why: Hospital follow  up               No Known Allergies  Consultations: Neurology cardiology  Procedures/Studies: CT ANGIO HEAD NECK W WO CM  Result Date: 12/04/2020 CLINICAL DATA:  Neuro deficit, acute, stroke suspected; right-sided weakness EXAM: CT ANGIOGRAPHY HEAD AND NECK CT PERFUSION BRAIN TECHNIQUE: Multidetector CT imaging of the head and neck was performed using the standard protocol during bolus administration of intravenous contrast. Multiplanar CT image reconstructions and MIPs were obtained to evaluate the vascular anatomy. Carotid stenosis measurements (when applicable) are obtained utilizing NASCET criteria, using the distal internal carotid diameter as the denominator. Multiphase CT imaging of the brain was performed following IV bolus contrast injection. Subsequent parametric perfusion maps were calculated using RAPID software. CONTRAST:  131mL OMNIPAQUE IOHEXOL 350 MG/ML SOLN COMPARISON:  None. FINDINGS: CTA NECK Aortic arch: Included thoracic aorta is unremarkable. Great vessel origins are patent. Right carotid system: Patent. No stenosis or evidence of dissection. Left carotid system: Patent.  No stenosis or evidence of dissection. Vertebral arteries: Patent. Left vertebral is dominant. No stenosis or evidence of dissection. Skeleton: No acute abnormality. Degenerative changes at the left temporomandibular joint. Other neck: Asymmetric left supraclavicular and cervical lymph nodes are probably reactive. Upper chest: Patchy ground-glass density in the included upper lobes, left greater than right. Review of the MIP images confirms the above findings CTA HEAD Anterior circulation: Intracranial internal carotid arteries are patent. Anterior and middle cerebral arteries are patent. Posterior circulation: Intracranial vertebral arteries, basilar artery, and posterior cerebral arteries are patent. Bilateral posterior communicating arteries are present. Venous sinuses: Patent as allowed by contrast  bolus timing. Review of the MIP images confirms the above findings CT Brain Perfusion Findings: CBF (<30%) Volume: 89mL Perfusion (Tmax>6.0s) volume: 45mL Mismatch Volume: 23mL Infarction Location: None. IMPRESSION: No large vessel occlusion, hemodynamically significant stenosis, or evidence of dissection. Perfusion imaging demonstrates no evidence of core infarction or penumbra. Patchy ground-glass density in the included upper lobes suspicious for pneumonia. Asymmetric nonenlarged left supraclavicular and cervical lymph nodes are probably reactive. Electronically Signed   By: Macy Mis M.D.   On: 12/04/2020 09:29   DG Eye Foreign Body  Result Date: 12/04/2020 CLINICAL DATA:  Metal working/exposure; clearance prior to MRI EXAM: ORBITS FOR FOREIGN BODY - 2 VIEW COMPARISON:  It CT 12/04/2020 FINDINGS: There is no evidence of metallic foreign body within the orbits. No significant bone abnormality identified. IMPRESSION: No evidence of metallic foreign body within the orbits. Electronically Signed   By: Marijo Sanes M.D.   On: 12/04/2020 11:16   DG Sacrum/Coccyx  Result Date: 12/04/2020 CLINICAL DATA:  Syncope, tailbone pain EXAM: SACRUM AND COCCYX - 2+ VIEW COMPARISON:  None. FINDINGS: Contrast in the bladder from a prior CT obscures the sacrum and coccyx on the AP projection. Within this confines, there is no evidence of acute fracture in the sacrum or coccyx. There is no other osseous abnormality. The SI joints and symphysis pubis are intact. The soft tissues are unremarkable. IMPRESSION: Contrast in the bladder obscures the sacrum and coccyx on the AP projection. Within this confines, no evidence of acute injury. Electronically  Signed   By: Valetta Mole M.D.   On: 12/04/2020 09:28   DG Shoulder Right  Result Date: 12/05/2020 CLINICAL DATA:  Right shoulder pain. EXAM: RIGHT SHOULDER - 2+ VIEW COMPARISON:  None. FINDINGS: There is no evidence of fracture or dislocation. There is no evidence of  arthropathy or other focal bone abnormality. Soft tissues are unremarkable. IMPRESSION: Negative. Electronically Signed   By: Iven Finn M.D.   On: 12/05/2020 15:05   CT Head Wo Contrast  Result Date: 12/04/2020 CLINICAL DATA:  30 year old male unresponsive, lethargy and right side weakness after fentanyl use. EXAM: CT HEAD WITHOUT CONTRAST TECHNIQUE: Contiguous axial images were obtained from the base of the skull through the vertex without intravenous contrast. COMPARISON:  Head CT 08/23/2010. FINDINGS: Brain: Normal cerebral volume. No midline shift, ventriculomegaly, mass effect, evidence of mass lesion, intracranial hemorrhage or evidence of cortically based acute infarction. Gray-white matter differentiation is within normal limits throughout the brain. Vascular: No suspicious intracranial vascular hyperdensity. Skull: Negative. Sinuses/Orbits: Trace fluid level left maxillary sinus. Other visualized paranasal sinuses and mastoids are clear. Other: Visualized orbits and scalp soft tissues are within normal limits. IMPRESSION: 1. Normal noncontrast CT appearance of the brain. 2. Trace left maxillary sinus fluid level. Electronically Signed   By: Genevie Ann M.D.   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 Right-sided weakness and numbness. Fentanyl use. EXAM: MRI HEAD WITHOUT CONTRAST MRI CERVICAL SPINE WITHOUT CONTRAST TECHNIQUE: Multiplanar, multiecho pulse sequences of the brain and surrounding structures, and cervical spine, to include the craniocervical junction and cervicothoracic junction, were obtained without intravenous contrast. COMPARISON:  Head CT, head and neck CTA, and CT head perfusion 12/04/2020. FINDINGS: MRI HEAD FINDINGS The study is mildly motion degraded. Brain: There are subcentimeter regions of restricted diffusion and T2 hyperintensity bilaterally in the globi pallidi, and  there is also a punctate focus of restricted diffusion at the posterior aspect of the right putamen. No definite restricted diffusion is evident in the cerebral cortex or cerebellum. There are a few scattered punctate foci of T2 hyperintensity in the cerebral white matter bilaterally suggesting nonspecific gliosis. The ventricles and sulci are normal. No intracranial hemorrhage, mass, midline shift, or extra-axial fluid collection is identified. Vascular: Major intracranial vascular flow voids are preserved. Skull and upper cervical spine: Unremarkable bone marrow signal para Sinuses/Orbits: Unremarkable orbits. Small mucous retention cyst in the left maxillary sinus. Clear mastoid air cells. Other: None. MRI CERVICAL SPINE FINDINGS The study is mildly motion degraded. Alignment: Normal. Vertebrae: No fracture, suspicious marrow lesion, or significant marrow edema. Cord: Normal signal and morphology. Posterior Fossa, vertebral arteries, paraspinal tissues: Unremarkable. Disc levels: C2-3: Mild right uncovertebral spurring without stenosis. C3-4: Tiny central disc protrusion and minimal right uncovertebral spurring without stenosis. C4-5: Minimal disc bulging and left uncovertebral spurring result in borderline left neural foraminal stenosis without spinal stenosis. C5-6: Tiny central disc protrusion and minimal uncovertebral spurring without stenosis. C6-7: Minimal disc bulging and minimal uncovertebral spurring without stenosis. C7-T1: Negative. IMPRESSION: 1. Small foci of cytotoxic edema in the basal ganglia bilaterally, nonspecific but potentially secondary to toxin exposure/drug abuse with this history. Other considerations include metabolic disturbances, acute lacunar infarcts, vasculitis, infection, and hypoxic-ischemic injury. 2. Minimal cervical spondylosis without evidence of significant stenosis. Normal appearance of the cervical spinal cord. Electronically Signed   By: Logan Bores M.D.   On: 12/04/2020  13:03  MR CERVICAL SPINE WO CONTRAST  Result Date: 12/04/2020 CLINICAL DATA:  Neuro deficit, acute, stroke suspected; Motor neuron disease. Right-sided weakness and numbness. Fentanyl use. EXAM: MRI HEAD WITHOUT CONTRAST MRI CERVICAL SPINE WITHOUT CONTRAST TECHNIQUE: Multiplanar, multiecho pulse sequences of the brain and surrounding structures, and cervical spine, to include the craniocervical junction and cervicothoracic junction, were obtained without intravenous contrast. COMPARISON:  Head CT, head and neck CTA, and CT head perfusion 12/04/2020. FINDINGS: MRI HEAD FINDINGS The study is mildly motion degraded. Brain: There are subcentimeter regions of restricted diffusion and T2 hyperintensity bilaterally in the globi pallidi, and there is also a punctate focus of restricted diffusion at the posterior aspect of the right putamen. No definite restricted diffusion is evident in the cerebral cortex or cerebellum. There are a few scattered punctate foci of T2 hyperintensity in the cerebral white matter bilaterally suggesting nonspecific gliosis. The ventricles and sulci are normal. No intracranial hemorrhage, mass, midline shift, or extra-axial fluid collection is identified. Vascular: Major intracranial vascular flow voids are preserved. Skull and upper cervical spine: Unremarkable bone marrow signal para Sinuses/Orbits: Unremarkable orbits. Small mucous retention cyst in the left maxillary sinus. Clear mastoid air cells. Other: None. MRI CERVICAL SPINE FINDINGS The study is mildly motion degraded. Alignment: Normal. Vertebrae: No fracture, suspicious marrow lesion, or significant marrow edema. Cord: Normal signal and morphology. Posterior Fossa, vertebral arteries, paraspinal tissues: Unremarkable. Disc levels: C2-3: Mild right uncovertebral spurring without stenosis. C3-4: Tiny central disc protrusion and minimal right uncovertebral spurring without stenosis. C4-5: Minimal disc bulging and left uncovertebral  spurring result in borderline left neural foraminal stenosis without spinal stenosis. C5-6: Tiny central disc protrusion and minimal uncovertebral spurring without stenosis. C6-7: Minimal disc bulging and minimal uncovertebral spurring without stenosis. C7-T1: Negative. IMPRESSION: 1. Small foci of cytotoxic edema in the basal ganglia bilaterally, nonspecific but potentially secondary to toxin exposure/drug abuse with this history. Other considerations include metabolic disturbances, acute lacunar infarcts, vasculitis, infection, and hypoxic-ischemic injury. 2. Minimal cervical spondylosis without evidence of significant stenosis. Normal appearance of the cervical spinal cord. Electronically Signed   By: Logan Bores M.D.   On: 12/04/2020 13:03   CT CEREBRAL PERFUSION W CONTRAST  Result Date: 12/04/2020 CLINICAL DATA:  Neuro deficit, acute, stroke suspected; right-sided weakness EXAM: CT ANGIOGRAPHY HEAD AND NECK CT PERFUSION BRAIN TECHNIQUE: Multidetector CT imaging of the head and neck was performed using the standard protocol during bolus administration of intravenous contrast. Multiplanar CT image reconstructions and MIPs were obtained to evaluate the vascular anatomy. Carotid stenosis measurements (when applicable) are obtained utilizing NASCET criteria, using the distal internal carotid diameter as the denominator. Multiphase CT imaging of the brain was performed following IV bolus contrast injection. Subsequent parametric perfusion maps were calculated using RAPID software. CONTRAST:  18mL OMNIPAQUE IOHEXOL 350 MG/ML SOLN COMPARISON:  None. FINDINGS: CTA NECK Aortic arch: Included thoracic aorta is unremarkable. Great vessel origins are patent. Right carotid system: Patent. No stenosis or evidence of dissection. Left carotid system: Patent.  No stenosis or evidence of dissection. Vertebral arteries: Patent. Left vertebral is dominant. No stenosis or evidence of dissection. Skeleton: No acute  abnormality. Degenerative changes at the left temporomandibular joint. Other neck: Asymmetric left supraclavicular and cervical lymph nodes are probably reactive. Upper chest: Patchy ground-glass density in the included upper lobes, left greater than right. Review of the MIP images confirms the above findings CTA HEAD Anterior circulation: Intracranial internal carotid arteries are patent. Anterior and middle cerebral arteries are patent. Posterior circulation: Intracranial  vertebral arteries, basilar artery, and posterior cerebral arteries are patent. Bilateral posterior communicating arteries are present. Venous sinuses: Patent as allowed by contrast bolus timing. Review of the MIP images confirms the above findings CT Brain Perfusion Findings: CBF (<30%) Volume: 17mL Perfusion (Tmax>6.0s) volume: 45mL Mismatch Volume: 53mL Infarction Location: None. IMPRESSION: No large vessel occlusion, hemodynamically significant stenosis, or evidence of dissection. Perfusion imaging demonstrates no evidence of core infarction or penumbra. Patchy ground-glass density in the included upper lobes suspicious for pneumonia. Asymmetric nonenlarged left supraclavicular and cervical lymph nodes are probably reactive. Electronically Signed   By: Macy Mis M.D.   On: 12/04/2020 09:29   DG CHEST PORT 1 VIEW  Result Date: 12/04/2020 CLINICAL DATA:  Aspiration into airway EXAM: PORTABLE CHEST 1 VIEW COMPARISON:  None. FINDINGS: The heart size and mediastinal contours are within normal limits. Both lungs are clear. The visualized skeletal structures are unremarkable. IMPRESSION: Negative. Electronically Signed   By: Rolm Baptise M.D.   On: 12/04/2020 17:42   ECHOCARDIOGRAM COMPLETE  Result Date: 12/05/2020    ECHOCARDIOGRAM REPORT   Patient Name:   LARENCE RABE Date of Exam: 12/05/2020 Medical Rec #:  [id number]     Height:       68.0 in Accession #:    [id number]    Weight:       170.0 lb Date of Birth:  [birth year]     BSA:           1.907 m Patient Age:    30 years      BP:           128/92 mmHg Patient Gender: M             HR:           70 bpm. Exam Location:  Inpatient Procedure: 2D Echo, Color Doppler and Cardiac Doppler Indications:    Stroke  History:        Patient has no prior history of Echocardiogram examinations.  Sonographer:    Jyl Heinz Referring Phys: [id number] DAVID MANUEL [place]  1. Left ventricular ejection fraction, by estimation, is 50 to 55%. The left ventricle has low normal function. The left ventricle has no regional wall motion abnormalities. There is mild asymmetric left ventricular hypertrophy of the basal-septal segment. Left ventricular diastolic parameters were normal.  2. Right ventricular systolic function is normal. The right ventricular size is normal.  3. Left atrial size was mildly dilated.  4. The mitral valve is normal in structure. Trivial mitral valve regurgitation. No evidence of mitral stenosis.  5. The aortic valve is tricuspid. Aortic valve regurgitation is not visualized. No aortic stenosis is present.  6. The inferior vena cava is dilated in size with <50% respiratory variability, suggesting right atrial pressure of 15 mmHg. FINDINGS  Left Ventricle: Left ventricular ejection fraction, by estimation, is 50 to 55%. The left ventricle has low normal function. The left ventricle has no regional wall motion abnormalities. The left ventricular internal cavity size was normal in size. There is mild asymmetric left ventricular hypertrophy of the basal-septal segment. Left ventricular diastolic parameters were normal. Normal left ventricular filling pressure. Right Ventricle: The right ventricular size is normal. No increase in right ventricular wall thickness. Right ventricular systolic function is normal. Left Atrium: Left atrial size was mildly dilated. Right Atrium: Right atrial size was normal in size. Pericardium: There is no evidence of pericardial effusion. Mitral Valve: The mitral  valve is normal in structure. Trivial  mitral valve regurgitation. No evidence of mitral valve stenosis. Tricuspid Valve: The tricuspid valve is normal in structure. Tricuspid valve regurgitation is not demonstrated. No evidence of tricuspid stenosis. Aortic Valve: The aortic valve is tricuspid. Aortic valve regurgitation is not visualized. No aortic stenosis is present. Aortic valve peak gradient measures 5.9 mmHg. Pulmonic Valve: The pulmonic valve was normal in structure. Pulmonic valve regurgitation is trivial. No evidence of pulmonic stenosis. Aorta: The aortic root is normal in size and structure. Venous: The inferior vena cava is dilated in size with less than 50% respiratory variability, suggesting right atrial pressure of 15 mmHg. IAS/Shunts: No atrial level shunt detected by color flow Doppler.  LEFT VENTRICLE PLAX 2D LVIDd:         4.80 cm      Diastology LVIDs:         3.50 cm      LV e' medial:    9.79 cm/s LV PW:         0.90 cm      LV E/e' medial:  8.4 LV IVS:        1.10 cm      LV e' lateral:   12.30 cm/s LVOT diam:     2.20 cm      LV E/e' lateral: 6.7 LV SV:         74 LV SV Index:   39 LVOT Area:     3.80 cm  LV Volumes (MOD) LV vol d, MOD A2C: 146.0 ml LV vol d, MOD A4C: 128.0 ml LV vol s, MOD A2C: 64.3 ml LV vol s, MOD A4C: 61.3 ml LV SV MOD A2C:     81.7 ml LV SV MOD A4C:     128.0 ml LV SV MOD BP:      73.6 ml RIGHT VENTRICLE             IVC RV Basal diam:  3.30 cm     IVC diam: 2.40 cm RV Mid diam:    2.70 cm RV S prime:     10.90 cm/s TAPSE (M-mode): 2.7 cm LEFT ATRIUM             Index        RIGHT ATRIUM           Index LA diam:        3.40 cm 1.78 cm/m   RA Area:     13.30 cm LA Vol (A2C):   58.9 ml 30.88 ml/m  RA Volume:   32.40 ml  16.99 ml/m LA Vol (A4C):   47.7 ml 25.01 ml/m LA Biplane Vol: 53.8 ml 28.21 ml/m  AORTIC VALVE AV Area (Vmax): 3.33 cm AV Vmax:        121.00 cm/s AV Peak Grad:   5.9 mmHg LVOT Vmax:      106.00 cm/s LVOT Vmean:     79.600 cm/s LVOT VTI:       0.195  m  AORTA Ao Root diam: 2.80 cm Ao Asc diam:  2.80 cm MITRAL VALVE MV Area (PHT): 4.68 cm    SHUNTS MV Decel Time: 162 msec    Systemic VTI:  0.20 m MV E velocity: 81.80 cm/s  Systemic Diam: 2.20 cm MV A velocity: 65.10 cm/s MV E/A ratio:  1.26 Skeet Latch MD Electronically signed by Skeet Latch MD Signature Date/Time: 12/05/2020/2:24:33 PM    Final     Subjective: Eager to go home  Discharge Exam: Vitals:   12/07/20 0454 12/07/20  0936  BP: (!) 157/99 (!) 156/106  Pulse: (!) 57 67  Resp: 20 19  Temp: 97.7 F (36.5 C) 97.8 F (36.6 C)  SpO2: 98% 99%   Vitals:   12/06/20 1809 12/06/20 2303 12/07/20 0454 12/07/20 0936  BP: (!) 161/98 (!) 163/91 (!) 157/99 (!) 156/106  Pulse: 61 (!) 58 (!) 57 67  Resp: 20 20 20 19   Temp: (!) 97.5 F (36.4 C) 98.1 F (36.7 C) 97.7 F (36.5 C) 97.8 F (36.6 C)  TempSrc: Oral Oral Oral Oral  SpO2: 100% 98% 98% 99%  Weight:      Height:        General: Pt is alert, awake, not in acute distress Cardiovascular: RRR, S1/S2 +, no rubs, no gallops Respiratory: CTA bilaterally, no wheezing, no rhonchi Abdominal: Soft, NT, ND, bowel sounds + Extremities: no edema, no cyanosis   The results of significant diagnostics from this hospitalization (including imaging, microbiology, ancillary and laboratory) are listed below for reference.     Microbiology: Recent Results (from the past 240 hour(s))  Resp Panel by RT-PCR (Flu A&B, Covid) Nasopharyngeal Swab     Status: None   Collection Time: 12/04/20 12:42 PM   Specimen: Nasopharyngeal Swab; Nasopharyngeal(NP) swabs in vial transport medium  Result Value Ref Range Status   SARS Coronavirus 2 by RT PCR NEGATIVE NEGATIVE Final    Comment: (NOTE) SARS-CoV-2 target nucleic acids are NOT DETECTED.  The SARS-CoV-2 RNA is generally detectable in upper respiratory specimens during the acute phase of infection. The lowest concentration of SARS-CoV-2 viral copies this assay can detect is 138  copies/mL. A negative result does not preclude SARS-Cov-2 infection and should not be used as the sole basis for treatment or other patient management decisions. A negative result may occur with  improper specimen collection/handling, submission of specimen other than nasopharyngeal swab, presence of viral mutation(s) within the areas targeted by this assay, and inadequate number of viral copies(<138 copies/mL). A negative result must be combined with clinical observations, patient history, and epidemiological information. The expected result is Negative.  Fact Sheet for Patients:  12/06/20  Fact Sheet for Healthcare Providers:  [employer]  This test is no t yet approved or cleared by the [employer] FDA and  has been authorized for detection and/or diagnosis of SARS-CoV-2 by FDA under an Emergency Use Authorization (EUA). This EUA will remain  in effect (meaning this test can be used) for the duration of the COVID-19 declaration under Section 564(b)(1) of the Act, 21 U.S.C.section 360bbb-3(b)(1), unless the authorization is terminated  or revoked sooner.       Influenza A by PCR NEGATIVE NEGATIVE Final   Influenza B by PCR NEGATIVE NEGATIVE Final    Comment: (NOTE) The Xpert Xpress SARS-CoV-2/FLU/RSV plus assay is intended as an aid in the diagnosis of influenza from Nasopharyngeal swab specimens and should not be used as a sole basis for treatment. Nasal washings and aspirates are unacceptable for Xpert Xpress SARS-CoV-2/FLU/RSV testing.  Fact Sheet for Patients: [place]  Fact Sheet for Healthcare Providers: BloggerCourse.com  This test is not yet approved or cleared by the [employer]ious[employer].[place] FDA and has been authorized for detection and/or diagnosis of SARS-CoV-2 by FDA under an Emergency Use Authorization (EUA). This EUA will remain in effect (meaning  this test can be used) for the duration of the COVID-19 declaration under Section 564(b)(1) of the Act, 21 U.S.C. section 360bbb-3(b)(1), unless the authorization is terminated or revoked.  Performed at [facility],  [place] [address], [place], [place] [address]      Labs: BNP (last 3 results) No results for input(s): BNP in the last 8760 hours. Basic Metabolic Panel: Recent Labs  Lab 12/04/20 0826 12/04/20 0853 12/06/20 0356 12/07/20 0405  NA 136 134* 138 137  K 4.7 4.5 3.8 3.7  CL 104 103 105 102  CO2  --  21* 26 25  GLUCOSE 128* 139* 102* 93  BUN 30* 27* 14 11  CREATININE 1.50* 1.41* 0.88 0.95  CALCIUM  --  8.8* 8.8* 9.3   Liver Function Tests: Recent Labs  Lab 12/04/20 0853 12/06/20 0356 12/07/20 0405  AST 301* 274* 164*  ALT 87* 131* 121*  ALKPHOS 42 37* 40  BILITOT 0.6 0.7 0.6  PROT 7.1 6.1* 6.6  ALBUMIN 4.5 3.6 3.6   No results for input(s): LIPASE, AMYLASE in the last 168 hours. No results for input(s): AMMONIA in the last 168 hours. CBC: Recent Labs  Lab 12/04/20 0826 12/04/20 0853  WBC  --  23.8*  NEUTROABS  --  21.7*  HGB 15.3 14.4  HCT 45.0 42.7  MCV  --  93.4  PLT  --  284   Cardiac Enzymes: Recent Labs  Lab 12/05/20 1115 12/06/20 0356 12/07/20 0405  CKTOTAL 20,341* 11,566* 5,575*   BNP: Invalid input(s): POCBNP CBG: No results for input(s): GLUCAP in the last 168 hours. D-Dimer No results for input(s): DDIMER in the last 72 hours. Hgb A1c Recent Labs    12/05/20 0405  HGBA1C 5.2   Lipid Profile Recent Labs    12/05/20 0406  CHOL 185  HDL 55  LDLCALC 100*  TRIG 149  CHOLHDL 3.4   Thyroid function studies No results for input(s): TSH, T4TOTAL, T3FREE, THYROIDAB in the last 72 hours.  Invalid input(s): FREET3 Anemia work up No results for input(s): VITAMINB12, FOLATE, FERRITIN, TIBC, IRON, RETICCTPCT in the last 72 hours. Urinalysis    Component Value Date/Time   COLORURINE COLORLESS (A)  12/04/2020 0803   APPEARANCEUR CLEAR 12/04/2020 0803   LABSPEC 1.004 (L) 12/04/2020 0803   PHURINE 6.0 12/04/2020 0803   GLUCOSEU NEGATIVE 12/04/2020 0803   HGBUR NEGATIVE 12/04/2020 0803   BILIRUBINUR NEGATIVE 12/04/2020 0803   KETONESUR NEGATIVE 12/04/2020 0803   PROTEINUR NEGATIVE 12/04/2020 0803   NITRITE NEGATIVE 12/04/2020 0803   LEUKOCYTESUR NEGATIVE 12/04/2020 0803   Sepsis Labs Invalid input(s): PROCALCITONIN,  WBC,  LACTICIDVEN Microbiology Recent Results (from the past 240 hour(s))  Resp Panel by RT-PCR (Flu A&B, Covid) Nasopharyngeal Swab     Status: None   Collection Time: 12/04/20 12:42 PM   Specimen: Nasopharyngeal Swab; Nasopharyngeal(NP) swabs in vial transport medium  Result Value Ref Range Status   SARS Coronavirus 2 by RT PCR NEGATIVE NEGATIVE Final    Comment: (NOTE) SARS-CoV-2 target nucleic acids are NOT DETECTED.  The SARS-CoV-2 RNA is generally detectable in upper respiratory specimens during the acute phase of infection. The lowest concentration of SARS-CoV-2 viral copies this assay can detect is 138 copies/mL. A negative result does not preclude SARS-Cov-2 infection and should not be used as the sole basis for treatment or other patient management decisions. A negative result may occur with  improper specimen collection/handling, submission of specimen other than nasopharyngeal swab, presence of viral mutation(s) within the areas targeted by this assay, and inadequate number of viral copies(<138 copies/mL). A negative result must be combined with clinical observations, patient history, and epidemiological information. The expected result is Negative.  Fact Sheet for Patients:  EntrepreneurPulse.com.au  Fact Sheet for Healthcare Providers:  IncredibleEmployment.be  This test is no t yet approved or cleared by the [place] FDA and  has been authorized for detection and/or diagnosis of SARS-CoV-2 by FDA under  an Emergency Use Authorization (EUA). This EUA will remain  in effect (meaning this test can be used) for the duration of the COVID-19 declaration under Section 564(b)(1) of the Act, 21 U.S.C.section 360bbb-3(b)(1), unless the authorization is terminated  or revoked sooner.       Influenza A by PCR NEGATIVE NEGATIVE Final   Influenza B by PCR NEGATIVE NEGATIVE Final    Comment: (NOTE) The Xpert Xpress SARS-CoV-2/FLU/RSV plus assay is intended as an aid in the diagnosis of influenza from Nasopharyngeal swab specimens and should not be used as a sole basis for treatment. Nasal washings and aspirates are unacceptable for Xpert Xpress SARS-CoV-2/FLU/RSV testing.  Fact Sheet for Patients: EntrepreneurPulse.com.au  Fact Sheet for Healthcare Providers: IncredibleEmployment.be  This test is not yet approved or cleared by the [place] FDA and has been authorized for detection and/or diagnosis of SARS-CoV-2 by FDA under an Emergency Use Authorization (EUA). This EUA will remain in effect (meaning this test can be used) for the duration of the COVID-19 declaration under Section 564(b)(1) of the Act, 21 U.S.C. section 360bbb-3(b)(1), unless the authorization is terminated or revoked.  Performed at [facility], [place] [address], [place],  [address]    Time spent: 30 min  SIGNED:   Marylu Lund, MD  [facility] 12/07/2020, 11:28 AM  If 7PM-7AM, please contact night-coverage

## 2020-12-07 NOTE — Progress Notes (Signed)
AVS given to patient and explained at the bedside. Medications and follow up appointments have been explained with pt verbalizing understanding.  

## 2020-12-07 NOTE — TOC Progression Note (Signed)
Transition of Care [facility]) - Progression Note    Patient Details  Name: Jeffery Hudson MRN: [id number] Date of Birth: 1990-07-30  [facility] CM/SW Contact  Liliana Cline, [place] Phone Number: 12/07/2020, 12:04 PM  Clinical Narrative:    Pt for dc today- needs PCP and outpatient therapy- CSW provided pt with a list of providers through [facility] and a list of outpatient PT/OT clinics where he can call to get set up for outpatient PT/OT as ordered.  Pt states he understands and agrees to plan.     Expected Discharge Plan: OP Rehab Barriers to Discharge: No Barriers Identified  Expected Discharge Plan and Services Expected Discharge Plan: OP Rehab       Living arrangements for the past 2 months: Single Family Home                                       Social Determinants of Health (SDOH) Interventions    Readmission Risk Interventions No flowsheet data found.

## 2021-09-02 ENCOUNTER — Emergency Department (HOSPITAL_COMMUNITY)
Admission: EM | Admit: 2021-09-02 | Discharge: 2021-09-02 | Discharge status: Against Medical Advice | Payer: Medicaid Other | Attending: Emergency Medicine | Admitting: Emergency Medicine

## 2021-09-02 ENCOUNTER — Other Ambulatory Visit: Payer: Self-pay

## 2021-09-02 ENCOUNTER — Emergency Department (HOSPITAL_COMMUNITY): Payer: Medicaid Other

## 2021-09-02 DIAGNOSIS — Z5321 Procedure and treatment not carried out due to patient leaving prior to being seen by health care provider: Secondary | ICD-10-CM | POA: Insufficient documentation

## 2021-09-02 DIAGNOSIS — W57XXXA Bitten or stung by nonvenomous insect and other nonvenomous arthropods, initial encounter: Secondary | ICD-10-CM | POA: Insufficient documentation

## 2021-09-02 DIAGNOSIS — S65401A Unspecified injury of blood vessel of right thumb, initial encounter: Secondary | ICD-10-CM | POA: Insufficient documentation

## 2021-09-02 NOTE — ED Provider Triage Note (Signed)
Emergency Medicine Provider Triage Evaluation Note  Jeffery Hudson , a 31 y.o. male  was evaluated in triage.  Pt complains of spider bite. He states that immediately prior to arrival he saw a brown spider on his hat and when he went to swat at it, the spider bit him on the right thumb. He states that the bite was painful and burning and then he began to feel lightheaded like he might pass out and short of breath as well. He states that he was concerned he might be having an allergic reaction or a reaction to 'the poison from the spider' and presented today with concerns for same. States that he no longer feels short of breath but does feel lightheaded.   Review of Systems  Positive:  Negative:   Physical Exam  BP (!) 159/110 (BP Location: Right Arm)   Pulse 85   Temp 98.1 F (36.7 C)   Resp 16   SpO2 100%  Gen:   Awake, no distress   Resp:  Normal effort, oropharynx clear, no mucosal swelling MSK:   Moves extremities without difficulty  Other:  Mild erythema and swelling noted to the dorsal aspect of the right thumb  Medical Decision Making  Medically screening exam initiated at 11:20 AM.  Appropriate orders placed.  Jeffery Hudson was informed that the remainder of the evaluation will be completed by another provider, this initial triage assessment does not replace that evaluation, and the importance of remaining in the ED until their evaluation is complete.     Silva Bandy, PA-C 09/02/21 1123

## 2021-09-02 NOTE — ED Triage Notes (Signed)
Pt states he saw a spider come down and bite his right thumb this morning. Burning pain.  Initially felt shob but has since resolved.  Swelling noted to right thumb.

## 2021-09-02 NOTE — ED Notes (Signed)
Pt stated he is feeling better and decided to leave.

## 2022-08-18 ENCOUNTER — Emergency Department (HOSPITAL_COMMUNITY)
Admission: EM | Admit: 2022-08-18 | Discharge: 2022-08-18 | Disposition: A | Discharge status: Home / Self Care | Attending: Emergency Medicine | Admitting: Emergency Medicine

## 2022-08-18 ENCOUNTER — Encounter (HOSPITAL_COMMUNITY): Payer: Self-pay

## 2022-08-18 ENCOUNTER — Other Ambulatory Visit: Payer: Self-pay

## 2022-08-18 DIAGNOSIS — W57XXXA Bitten or stung by nonvenomous insect and other nonvenomous arthropods, initial encounter: Secondary | ICD-10-CM | POA: Diagnosis not present

## 2022-08-18 DIAGNOSIS — S0086XA Insect bite (nonvenomous) of other part of head, initial encounter: Secondary | ICD-10-CM | POA: Diagnosis present

## 2022-08-18 DIAGNOSIS — J45909 Unspecified asthma, uncomplicated: Secondary | ICD-10-CM | POA: Diagnosis not present

## 2022-08-18 DIAGNOSIS — L03211 Cellulitis of face: Secondary | ICD-10-CM | POA: Insufficient documentation

## 2022-08-18 MED ORDER — OXYCODONE-ACETAMINOPHEN 5-325 MG PO TABS: 1.0000 | ORAL_TABLET | Freq: Four times a day (QID) | ORAL | 0 refills | Status: DC | PRN

## 2022-08-18 MED ORDER — LIDOCAINE HCL (PF) 1 % IJ SOLN
10.0000 mL | Freq: Once | INTRAMUSCULAR | Status: AC
  Administered 2022-08-18: 10 mL
  Filled 2022-08-18: qty 30

## 2022-08-18 MED ORDER — OXYCODONE-ACETAMINOPHEN 5-325 MG PO TABS
1.0000 | ORAL_TABLET | Freq: Once | ORAL | Status: AC
  Administered 2022-08-18: 1 via ORAL
  Filled 2022-08-18: qty 1

## 2022-08-18 MED ORDER — CEPHALEXIN 500 MG PO CAPS: 500.0000 mg | ORAL_CAPSULE | Freq: Four times a day (QID) | ORAL | 0 refills | Status: DC

## 2022-08-18 MED ORDER — CEPHALEXIN 500 MG PO CAPS
500.0000 mg | ORAL_CAPSULE | Freq: Once | ORAL | Status: AC
  Administered 2022-08-18: 500 mg via ORAL
  Filled 2022-08-18: qty 1

## 2022-08-18 NOTE — Discharge Instructions (Addendum)
Please take your antibiotics as prescribed. Take tylenol/ibuprofen or percocet as needed for pain. I recommend close follow-up with PCP for reevaluation.  Please do not hesitate to return to emergency department if worrisome signs symptoms we discussed become apparent.

## 2022-08-18 NOTE — ED Notes (Signed)
Pt discharged with officer. Discharge information discussed. No s/s of distress observed during discharge.

## 2022-08-18 NOTE — ED Provider Notes (Signed)
Jeffery Hudson Provider Note   CSN: [id number] Arrival date & time: 08/18/22  0865     History  No chief complaint on file.   Jeffery Hudson is a 32 y.o. male presents today for evaluation of insect bite on face.  Patient reports that he has noted pain, swelling and redness on the left cheek in the last 3 days.  He did not remember when or how he got an insect bite.  He denies any fever, nausea or vomiting.  States the redness and swelling has spread near his left eye.  Denies any vision changes. Patient is here from a correctional facility escorted by a Emergency planning/management officer.  HPI    Past Medical History:  Diagnosis Date   ADHD    Bipolar disorder (HCC)    Childhood asthma    Drug abuse (HCC)    Migraines    History reviewed. No pertinent surgical history.   Home Medications Prior to Admission medications   Medication Sig Start Date End Date Taking? Authorizing Provider  ibuprofen (ADVIL,MOTRIN) 800 MG tablet Take 1 tablet (800 mg total) by mouth 3 (three) times daily. Patient not taking: Reported on 12/04/2020 10/21/12   Trixie Dredge, PA-C      Allergies    Patient has no known allergies.    Review of Systems   Review of Systems Negative except as per HPI.  Physical Exam Updated Vital Signs Ht 5\' 8"  (1.727 m)   Wt 83.9 kg   BMI 28.13 kg/m  Physical Exam Vitals and nursing note reviewed.  Constitutional:      Appearance: Normal appearance.  HENT:     Head: Normocephalic and atraumatic.     Mouth/Throat:     Mouth: Mucous membranes are moist.  Eyes:     General: No scleral icterus. Cardiovascular:     Rate and Rhythm: Normal rate and regular rhythm.     Pulses: Normal pulses.     Heart sounds: Normal heart sounds.  Pulmonary:     Effort: Pulmonary effort is normal.     Breath sounds: Normal breath sounds.  Abdominal:     General: Abdomen is flat.     Palpations: Abdomen is soft.     Tenderness: There is no abdominal  tenderness.  Musculoskeletal:        General: No deformity.  Skin:    General: Skin is warm.     Findings: No rash.     Comments: Tenderness to palpation with swelling and overlying skin erythema on the left cheek. No bleeding, pus or fluid drainage noted.   Neurological:     General: No focal deficit present.     Mental Status: He is alert.  Psychiatric:        Mood and Affect: Mood normal.     ED Results / Procedures / Treatments   Labs (all labs ordered are listed, but only abnormal results are displayed) Labs Reviewed - No data to display  EKG None  Radiology No results found.  Procedures .Marland KitchenIncision and Drainage  Date/Time: 08/18/2022 10:14 AM  Performed by: Jeanelle Malling, PA Authorized by: Jeanelle Malling, PA   Consent:    Consent obtained:  Verbal   Consent given by:  Patient   Risks discussed:  Bleeding, incomplete drainage, pain and damage to other organs   Alternatives discussed:  No treatment Universal protocol:    Procedure explained and questions answered to patient or proxy's satisfaction: yes  Relevant documents present and verified: yes     Test results available : yes     Imaging studies available: yes     Required blood products, implants, devices, and special equipment available: yes     Site/side marked: yes     Patient identity confirmed:  Verbally with patient Location:    Indications for incision and drainage: cellulitis/abscess.   Size:  2 x 2 Pre-procedure details:    Skin preparation:  Betadine Anesthesia:    Anesthesia method:  Local infiltration   Local anesthetic:  Lidocaine 1% w/o epi Procedure type:    Complexity:  Simple Procedure details:    Ultrasound guidance: yes     Needle aspiration: no     Incision types:  Single straight   Incision depth:  Subcutaneous   Wound management:  Probed and deloculated, irrigated with saline and extensive cleaning   Drainage:  Bloody   Drainage amount:  Scant   Wound treatment:  Wound left open    Packing materials:  None Post-procedure details:    Procedure completion:  Tolerated well, no immediate complications     Medications Ordered in ED Medications  lidocaine (PF) (XYLOCAINE) 1 % injection 10 mL (10 mLs Infiltration Given 08/18/22 0956)    ED Course/ Medical Decision Making/ A&P                                 Medical Decision Making Risk Prescription drug management.   This patient presents to the ED for skin infection, this involves an extensive number of treatment options, and is a complaint that carries with a high risk of complications and morbidity.  The differential diagnosis includes cellulitis, abscess, preseptal cellulitis.  This is not an exhaustive list.  Problem list/ ED course/ Critical interventions/ Medical management: HPI: See above Vital signs within normal range and stable throughout visit. Laboratory/imaging studies significant for: See above. On physical examination, patient is afebrile and appears in no acute distress. This patient presents with initial presentation of local erythema, warmth, swelling concerning for cellulitis. Sensitivity/pain to light touch around the erythematous area. No lymphangitic spread visible and no fluid pockets or fluctuance concerning for abscess noted.  There is a 2 x 2 centimeter area of induration on the left cheek, I&D attempted however there was no purulent discharge noted with very minimal bleeding.  Bleeding is controlled and patient is stable for discharge. Low concern for osteomyelitis. No immune compromise, bullae, pain out of proportion, or rapid progression concerning for necrotizing fasciitis. Patient to be discharged home with keflex with follow up with their PCP. I have reviewed the patient home medicines and have made adjustments as needed.  Cardiac monitoring/EKG: The patient was maintained on a cardiac monitor.  I personally reviewed and interpreted the cardiac monitor which showed an underlying rhythm of:  sinus rhythm.  Additional history obtained: External records from outside source obtained and reviewed including: Chart review including previous notes, labs, imaging.  Consultations obtained:  Disposition Continued outpatient therapy. Follow-up with PCP recommended for reevaluation of symptoms. Treatment plan discussed with patient.  Pt acknowledged understanding was agreeable to the plan. Worrisome signs and symptoms were discussed with patient, and patient acknowledged understanding to return to the ED if they noticed these signs and symptoms. Patient was stable upon discharge.   This chart was dictated using voice recognition software.  Despite best efforts to proofread,  errors can occur which can change  the documentation meaning.          Final Clinical Impression(s) / ED Diagnoses Final diagnoses:  Cellulitis of face    Rx / DC Orders ED Discharge Orders          Ordered    cephALEXin (KEFLEX) 500 MG capsule  4 times daily        08/18/22 1007    oxyCODONE-acetaminophen (PERCOCET/ROXICET) 5-325 MG tablet  Every 6 hours PRN        08/18/22 1009              Jeanelle Malling, [place] 08/18/22 1019    Gwyneth Sprout, MD 08/18/22 1458

## 2022-08-18 NOTE — ED Triage Notes (Signed)
The pt bib officer in a car. The pt is c/o possible insect ite to left cheek and inflamed lymph node to left side of neck.

## 2022-10-13 ENCOUNTER — Encounter (HOSPITAL_COMMUNITY): Payer: Self-pay | Admitting: *Deleted

## 2022-10-13 ENCOUNTER — Other Ambulatory Visit: Payer: Self-pay

## 2022-10-13 ENCOUNTER — Ambulatory Visit (HOSPITAL_COMMUNITY)
Admission: EM | Admit: 2022-10-13 | Discharge: 2022-10-13 | Disposition: A | Discharge status: Home / Self Care | Payer: MEDICAID | Attending: Internal Medicine | Admitting: Internal Medicine

## 2022-10-13 DIAGNOSIS — R030 Elevated blood-pressure reading, without diagnosis of hypertension: Secondary | ICD-10-CM | POA: Diagnosis not present

## 2022-10-13 DIAGNOSIS — J029 Acute pharyngitis, unspecified: Secondary | ICD-10-CM

## 2022-10-13 LAB — POCT RAPID STREP A (OFFICE): Rapid Strep A Screen: NEGATIVE

## 2022-10-13 MED ORDER — AMLODIPINE BESYLATE 5 MG PO TABS: 5.0000 mg | ORAL_TABLET | Freq: Every day | ORAL | 0 refills | Status: AC

## 2022-10-13 NOTE — ED Provider Notes (Signed)
[facility]    CSN: [id number] Arrival date & time: 10/13/22  0825      History   Chief Complaint Chief Complaint  Patient presents with   Fever   Sore Throat    HPI Jeffery Hudson is a 32 y.o. male.   Sore throat since yesterday, worse today, subjective fever during the night, felt hot, and a little sweaty. Admits mild fatigue.  Recently dx with HTN while in jail, was given medication ( he is unable to recall the name) ran out 2 weeks ago, does not have a PCP.    Fever Associated symptoms: sore throat   Associated symptoms: no chest pain, no chills, no congestion, no cough, no diarrhea, no ear pain, no headaches, no nausea, no rash, no rhinorrhea and no vomiting   Sore Throat Pertinent negatives include no chest pain, no abdominal pain and no headaches.    Past Medical History:  Diagnosis Date   ADHD    Bipolar disorder (HCC)    Childhood asthma    Drug abuse (HCC)    Migraines     Patient Active Problem List   Diagnosis Date Noted   Rhabdomyolysis 12/05/2020   Elevated troponin    Right sided weakness 12/04/2020   AKI (acute kidney injury) (HCC) 12/04/2020   Abnormal LFTs 12/04/2020   Hypocalcemia 12/04/2020   Polysubstance abuse (HCC) 12/04/2020   Alcohol abuse 12/04/2020    History reviewed. No pertinent surgical history.     Home Medications    Prior to Admission medications   Medication Sig Start Date End Date Taking? Authorizing Provider  amLODipine (NORVASC) 5 MG tablet Take 1 tablet (5 mg total) by mouth daily. 10/13/22 11/12/22 Yes Lamptey, Britta Mccreedy, MD    Family History Family History  Problem Relation Age of Onset   Bipolar disorder Mother    Atrial fibrillation Mother    Bipolar disorder Father    Congestive Heart Failure Maternal Grandmother    Heart disease Maternal Aunt    Heart disease Maternal Aunt    Ovarian cancer Maternal Aunt    Atrial fibrillation Maternal Aunt     Social History Social History   Tobacco  Use   Smoking status: Every Day    Current packs/day: 1.00    Average packs/day: 1 pack/day for 10.0 years (10.0 ttl pk-yrs)    Types: Cigarettes   Smokeless tobacco: Never  Substance Use Topics   Alcohol use: Yes    Alcohol/week: 28.0 standard drinks of alcohol    Types: 28 Cans of beer per week    Comment: 3-4 beers per night   Drug use: Yes    Types: Cocaine    Comment: benzos, heroin     Allergies   Patient has no known allergies.   Review of Systems Review of Systems  Constitutional:  Positive for fatigue and fever. Negative for appetite change and chills.  HENT:  Positive for sore throat. Negative for congestion, ear pain, postnasal drip, rhinorrhea, sinus pain, trouble swallowing and voice change.   Respiratory:  Negative for cough.   Cardiovascular:  Negative for chest pain.  Gastrointestinal:  Negative for abdominal pain, diarrhea, nausea and vomiting.  Musculoskeletal:  Negative for arthralgias.  Skin:  Negative for rash.  Neurological:  Negative for headaches.     Physical Exam Triage Vital Signs ED Triage Vitals  Encounter Vitals Group     BP 10/13/22 0843 (!) 161/93     Systolic BP Percentile --  Diastolic BP Percentile --      Pulse Rate 10/13/22 0843 (!) 104     Resp 10/13/22 0843 18     Temp 10/13/22 0843 98.4 F (36.9 C)     Temp src --      SpO2 10/13/22 0843 97 %     Weight --      Height --      Head Circumference --      Peak Flow --      Pain Score 10/13/22 0840 6     Pain Loc --      Pain Education --      Exclude from Growth Chart --    No data found.  Updated Vital Signs BP (!) 161/93   Pulse (!) 104   Temp 98.4 F (36.9 C)   Resp 18   SpO2 97%   Visual Acuity Right Eye Distance:   Left Eye Distance:   Bilateral Distance:    Right Eye Near:   Left Eye Near:    Bilateral Near:     Physical Exam Vitals reviewed.  Constitutional:      Appearance: He is not ill-appearing.  HENT:     Head: Normocephalic and  atraumatic.     Right Ear: Tympanic membrane and ear canal normal.     Left Ear: Tympanic membrane and ear canal normal.     Nose: No congestion or rhinorrhea.     Mouth/Throat:     Mouth: Mucous membranes are moist. No oral lesions.     Pharynx: Oropharynx is clear. Uvula midline. Posterior oropharyngeal erythema (minimal) present. No pharyngeal swelling, oropharyngeal exudate or uvula swelling.     Tonsils: No tonsillar exudate or tonsillar abscesses.  Eyes:     Conjunctiva/sclera: Conjunctivae normal.  Cardiovascular:     Rate and Rhythm: Normal rate and regular rhythm.  Pulmonary:     Effort: Pulmonary effort is normal.     Breath sounds: Normal breath sounds.  Musculoskeletal:     Cervical back: Neck supple.  Lymphadenopathy:     Cervical: No cervical adenopathy.  Neurological:     Mental Status: He is alert and oriented to person, place, and time.      UC Treatments / Results  Labs (all labs ordered are listed, but only abnormal results are displayed) Labs Reviewed  POCT RAPID STREP A (OFFICE)    EKG   Radiology No results found.  Procedures Procedures (including critical care time)  Medications Ordered in UC Medications - No data to display  Initial Impression / Assessment and Plan / UC Course  I have reviewed the triage vital signs and the nursing notes.  Pertinent labs & imaging results that were available during my care of the patient were reviewed by me and considered in my medical decision making (see chart for details).    Pt with elevated BP, recently dx with HTN, not taking medications, does not have PCP. Pt given instructions on signing up for MyChart and info on obtaining a PCP. Will start Amlodipine, given 30 day Rx.Encouraged establishing ASAP with a PCP Strep negative, no red flags, suspect viral pharyngitis. OTC tylenol, salt water gargles, throat lozenges for symptomatic relief.Warning signs and f/u reviewed. Recommend home COID test if  development of fever, cough.   Final Clinical Impressions(s) / UC Diagnoses   Final diagnoses:  Viral pharyngitis  Elevated blood pressure reading   Discharge Instructions   None    ED Prescriptions     Medication Sig  Dispense Auth. Provider   amLODipine (NORVASC) 5 MG tablet Take 1 tablet (5 mg total) by mouth daily. 30 tablet Lamptey, Britta Mccreedy, MD      PDMP not reviewed this encounter.   Meliton Rattan, [place] 10/13/22 [phone]

## 2022-10-13 NOTE — ED Triage Notes (Signed)
Pt reports having a sore throat and fever since yesterday.

## 2023-06-24 IMAGING — CR DG ORBITS FOR FOREIGN BODY
2 series · 2 of 2 positions shown · non-contrast
Comparison: It CT 12/04/2020

CLINICAL DATA: Metal working/exposure; clearance prior to MRI

EXAM:
ORBITS FOR FOREIGN BODY - 2 VIEW

[x sinuses ap (1 of 2)]
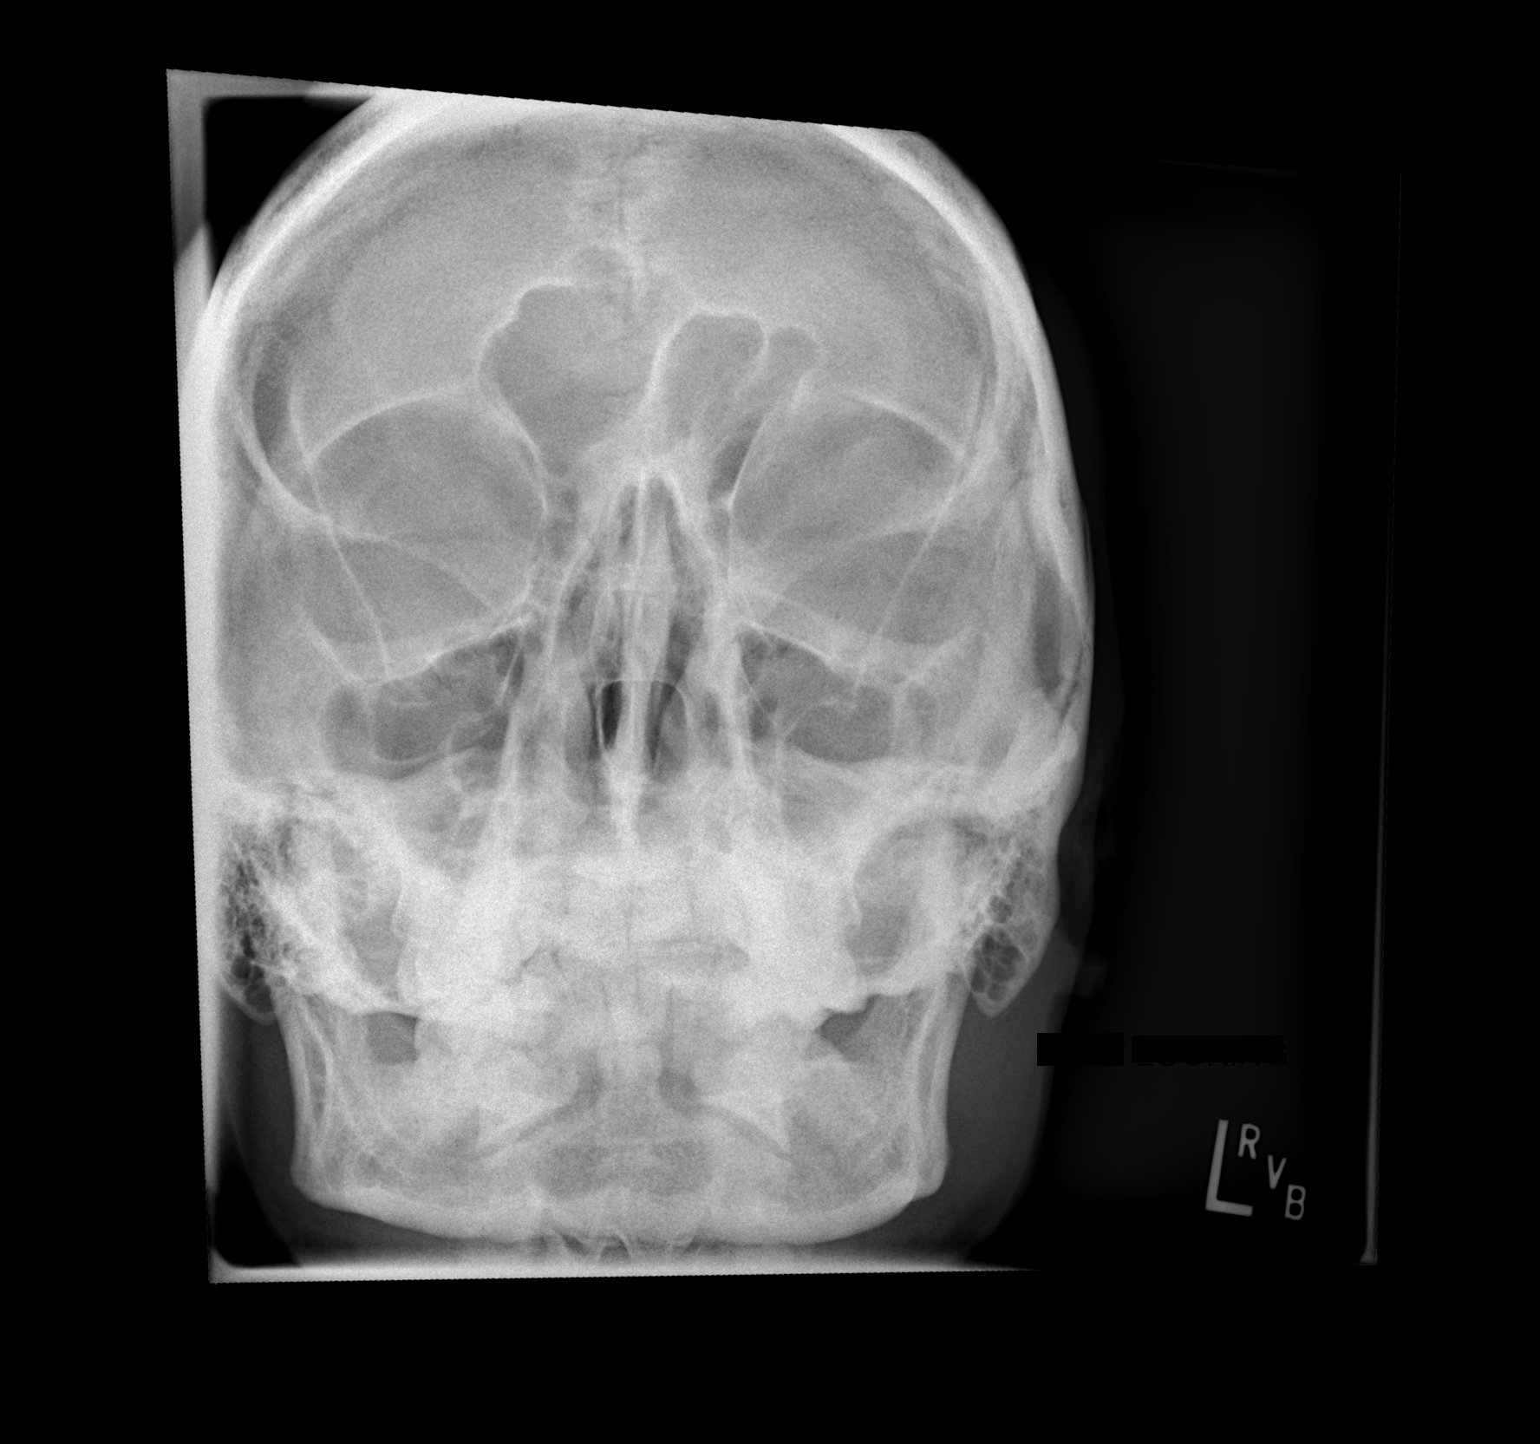

[x sinuses ap (2 of 2)]
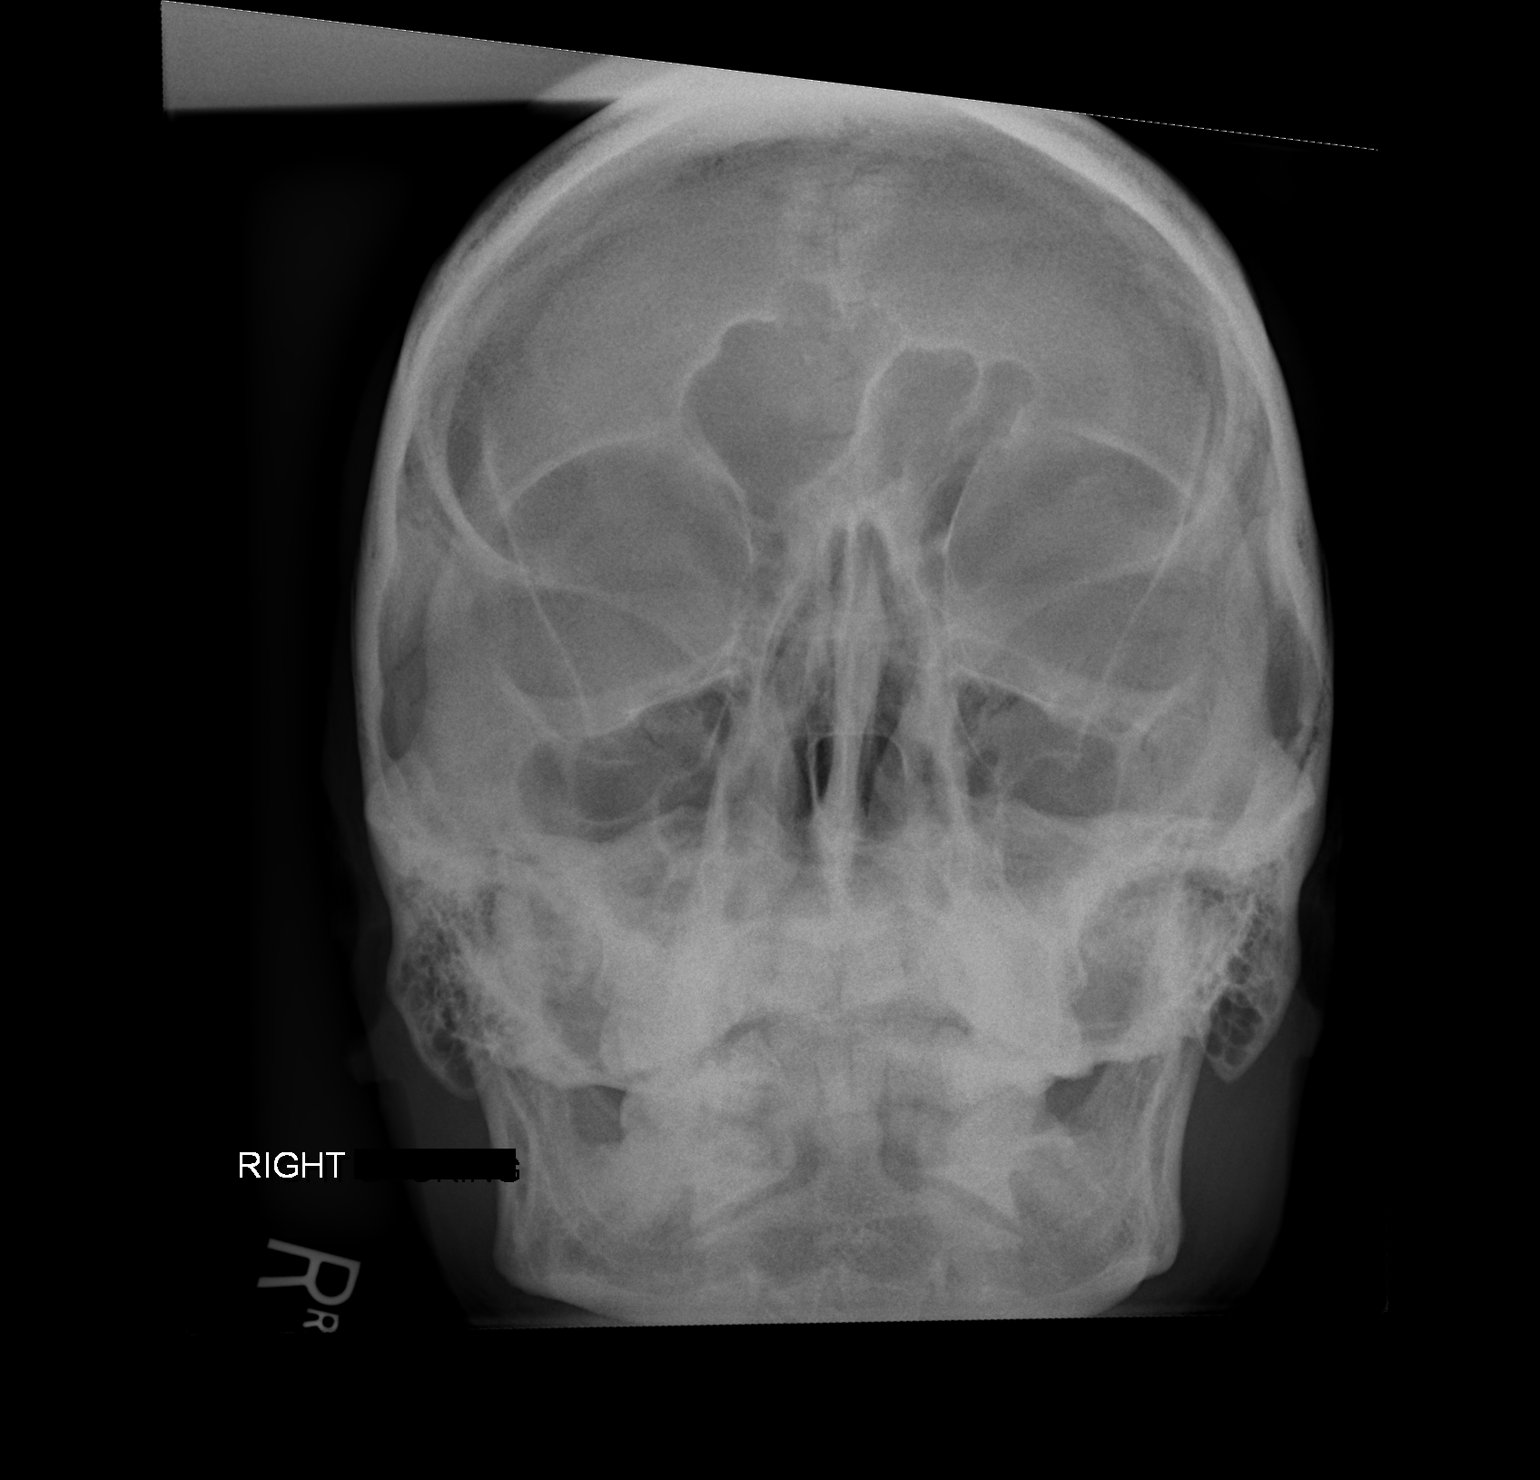

[2 of 2 positions shown; findings below may reference images not displayed]

FINDINGS: There is no evidence of metallic foreign body within the orbits. No
significant bone abnormality identified.
IMPRESSION: No evidence of metallic foreign body within the orbits.

## 2023-06-25 IMAGING — DX DG SHOULDER 2+V*R*
2 series · 2 of 2 positions shown · non-contrast
Comparison: None.

CLINICAL DATA: Right shoulder pain.

EXAM:
RIGHT SHOULDER - 2+ VIEW

[shoulder axial]
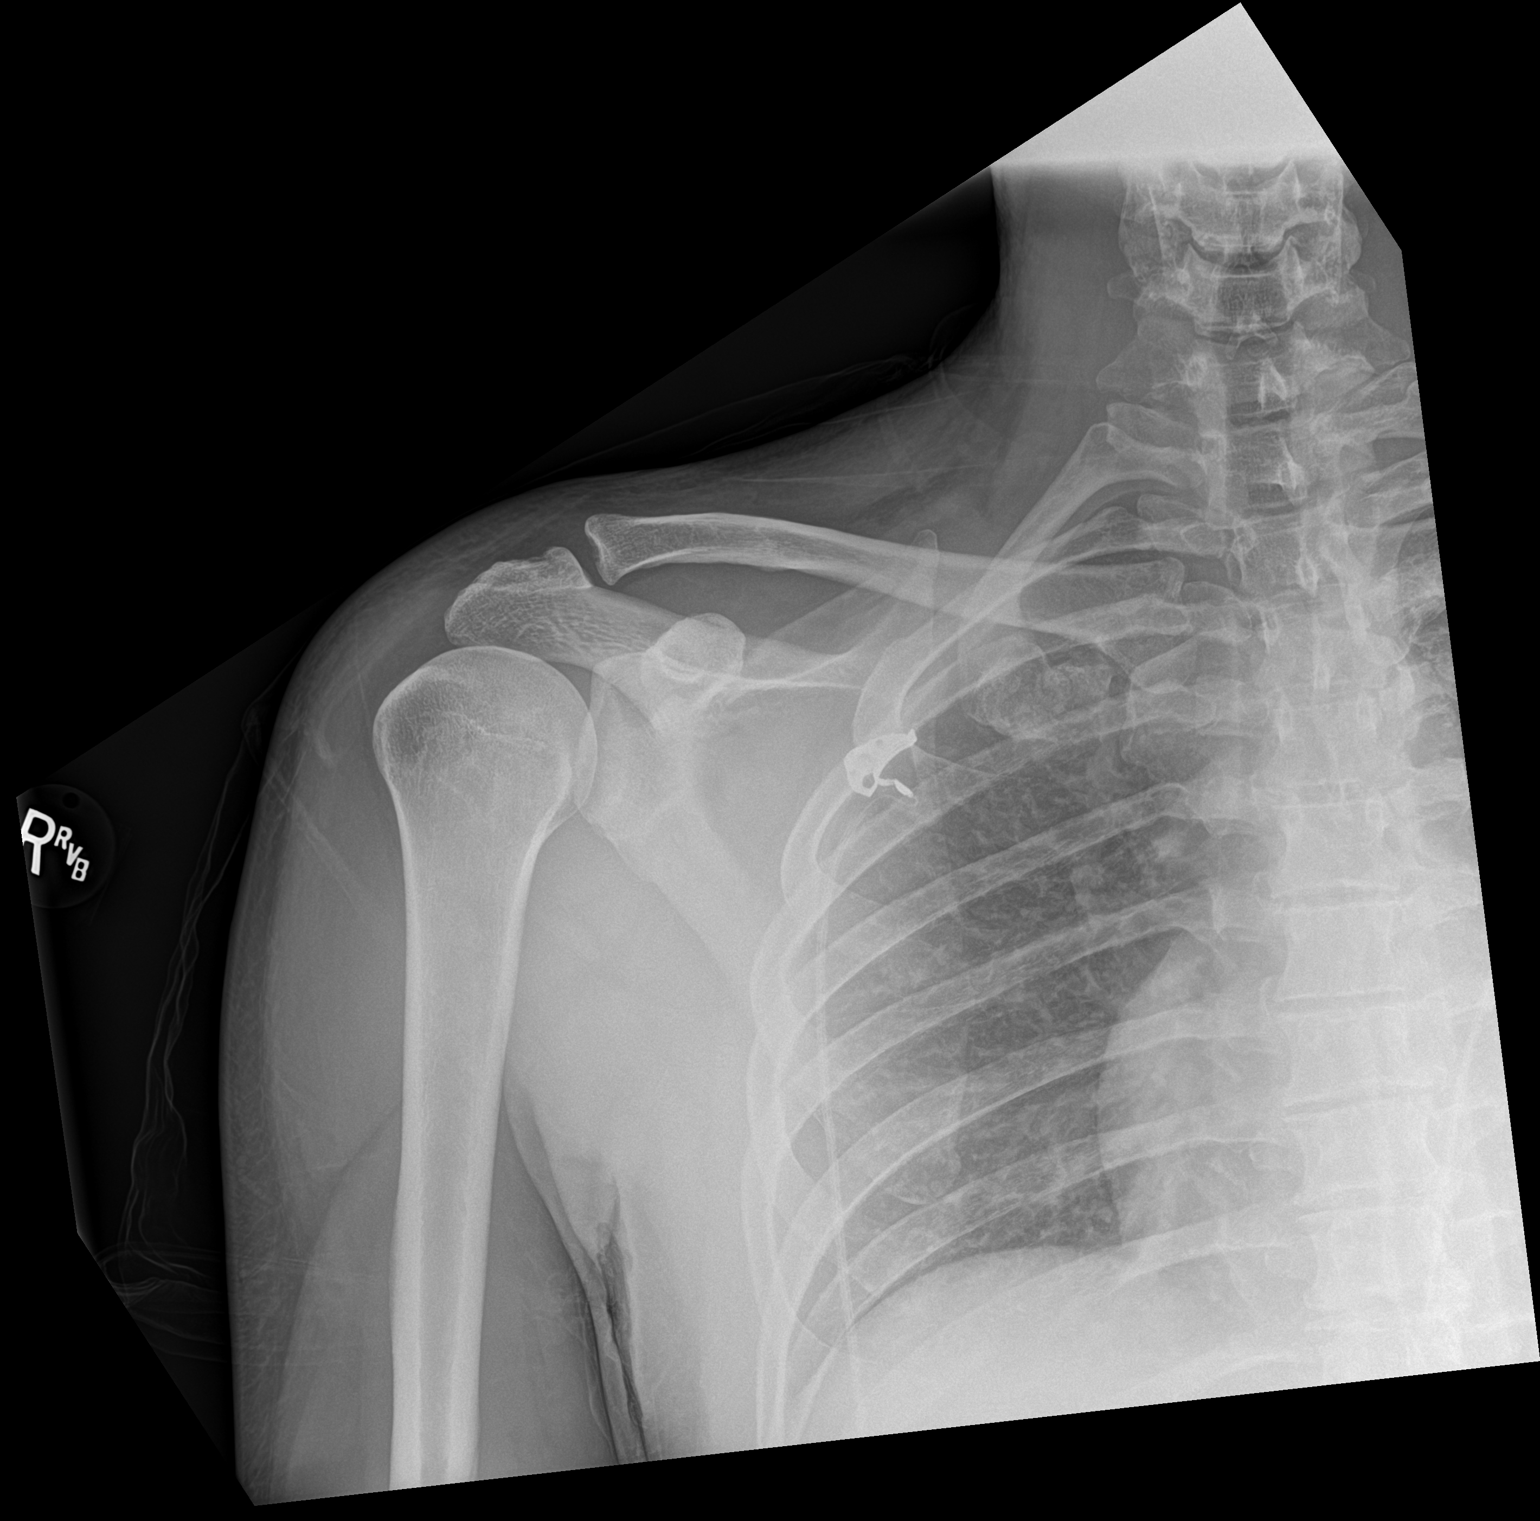

[shoulder obl]
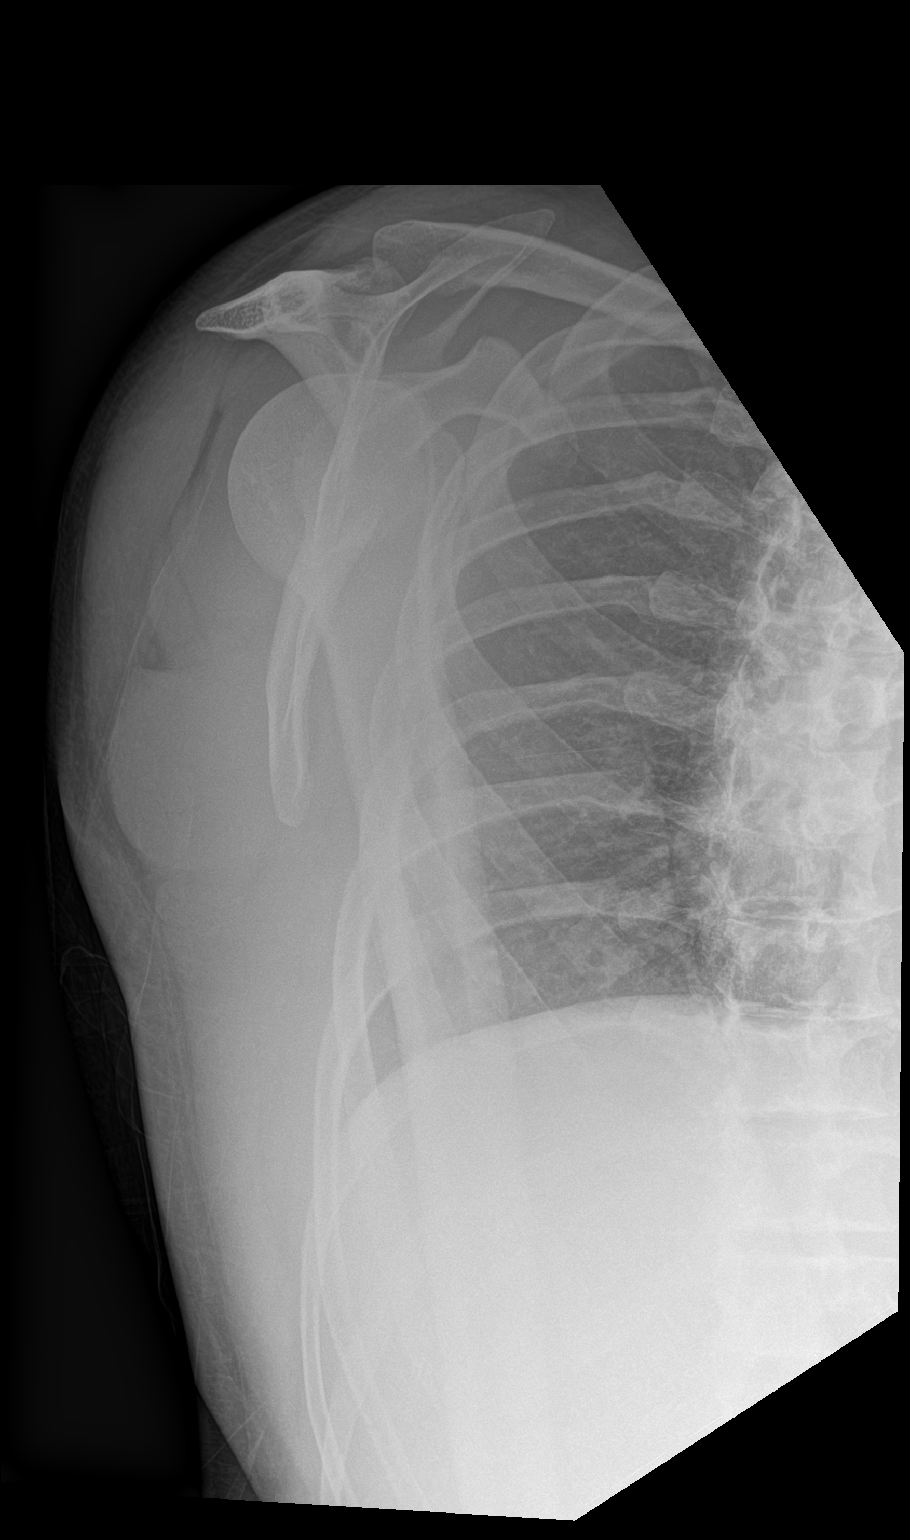

[2 of 2 positions shown; findings below may reference images not displayed]

FINDINGS: There is no evidence of fracture or dislocation. There is no
evidence of arthropathy or other focal bone abnormality. Soft
tissues are unremarkable.
IMPRESSION: Negative.

## 2023-09-15 ENCOUNTER — Emergency Department (HOSPITAL_COMMUNITY)
Admission: EM | Admit: 2023-09-15 | Discharge: 2023-09-16 | Discharge status: Against Medical Advice | Payer: Self-pay | Attending: Emergency Medicine | Admitting: Emergency Medicine

## 2023-09-15 ENCOUNTER — Encounter (HOSPITAL_COMMUNITY): Payer: Self-pay | Admitting: Emergency Medicine

## 2023-09-15 ENCOUNTER — Other Ambulatory Visit: Payer: Self-pay

## 2023-09-15 DIAGNOSIS — K4090 Unilateral inguinal hernia, without obstruction or gangrene, not specified as recurrent: Secondary | ICD-10-CM | POA: Insufficient documentation

## 2023-09-15 DIAGNOSIS — K5900 Constipation, unspecified: Secondary | ICD-10-CM | POA: Insufficient documentation

## 2023-09-15 DIAGNOSIS — Z5321 Procedure and treatment not carried out due to patient leaving prior to being seen by health care provider: Secondary | ICD-10-CM | POA: Insufficient documentation

## 2023-09-15 LAB — CBC
HCT: 40.7 % (ref 39.0–52.0)
Hemoglobin: 13.8 g/dL (ref 13.0–17.0)
MCH: 30 pg (ref 26.0–34.0)
MCHC: 33.9 g/dL (ref 30.0–36.0)
MCV: 88.5 fL (ref 80.0–100.0)
Platelets: 351 K/uL (ref 150–400)
RBC: 4.6 MIL/uL (ref 4.22–5.81)
RDW: 13.3 % (ref 11.5–15.5)
WBC: 11 K/uL — ABNORMAL HIGH (ref 4.0–10.5)
nRBC: 0 % (ref 0.0–0.2)

## 2023-09-15 LAB — COMPREHENSIVE METABOLIC PANEL WITH GFR
ALT: 14 U/L (ref 0–44)
AST: 21 U/L (ref 15–41)
Albumin: 4.3 g/dL (ref 3.5–5.0)
Alkaline Phosphatase: 65 U/L (ref 38–126)
Anion gap: 12 (ref 5–15)
BUN: 16 mg/dL (ref 6–20)
CO2: 25 mmol/L (ref 22–32)
Calcium: 9.5 mg/dL (ref 8.9–10.3)
Chloride: 100 mmol/L (ref 98–111)
Creatinine, Ser: 0.94 mg/dL (ref 0.61–1.24)
GFR, Estimated: >60 mL/min (ref >60)
Glucose, Bld: 128 mg/dL — ABNORMAL HIGH (ref 70–99)
Potassium: 4 mmol/L (ref 3.5–5.1)
Sodium: 137 mmol/L (ref 135–145)
Total Bilirubin: 0.5 mg/dL (ref 0.0–1.2)
Total Protein: 7.3 g/dL (ref 6.5–8.1)

## 2023-09-15 LAB — LIPASE, BLOOD: Lipase: 25 U/L (ref 11–51)

## 2023-09-15 NOTE — ED Triage Notes (Signed)
 Patient c/o something don't feel right up in there after straining during BM and having constipation. Patient reports lump in groin area that was not there before this episode, denies previous hx of hernia.

## 2023-09-15 NOTE — ED Triage Notes (Signed)
 In addition to first triage note, pt states he manually placed his finger up his rectum during the constipation episode but felt no stool, but lots of mucous - denies any blood in toilet. Pain is reported to low abdomen, radiates to R groin

## 2023-09-16 NOTE — ED Notes (Signed)
 Called pt x2 for room, no response.
